# Patient Record
Sex: Female | Born: 1970 | Race: White | Hispanic: No | Marital: Married | Smoking: Never smoker
Health system: Southern US, Community
[De-identification: ages and names within clinical notes are randomized; demographics above are authoritative.]

## PROBLEM LIST (undated history)

## (undated) DIAGNOSIS — Z800 Family history of malignant neoplasm of digestive organs: Secondary | ICD-10-CM

## (undated) DIAGNOSIS — F988 Other specified behavioral and emotional disorders with onset usually occurring in childhood and adolescence: Secondary | ICD-10-CM

## (undated) DIAGNOSIS — G40909 Epilepsy, unspecified, not intractable, without status epilepticus: Secondary | ICD-10-CM

## (undated) DIAGNOSIS — F329 Major depressive disorder, single episode, unspecified: Secondary | ICD-10-CM

## (undated) DIAGNOSIS — Z9889 Other specified postprocedural states: Secondary | ICD-10-CM

## (undated) DIAGNOSIS — R32 Unspecified urinary incontinence: Secondary | ICD-10-CM

## (undated) DIAGNOSIS — E739 Lactose intolerance, unspecified: Secondary | ICD-10-CM

## (undated) DIAGNOSIS — E559 Vitamin D deficiency, unspecified: Secondary | ICD-10-CM

## (undated) DIAGNOSIS — F419 Anxiety disorder, unspecified: Secondary | ICD-10-CM

## (undated) DIAGNOSIS — R51 Headache: Secondary | ICD-10-CM

## (undated) DIAGNOSIS — Z8489 Family history of other specified conditions: Secondary | ICD-10-CM

## (undated) DIAGNOSIS — T4145XA Adverse effect of unspecified anesthetic, initial encounter: Secondary | ICD-10-CM

## (undated) DIAGNOSIS — Z8711 Personal history of peptic ulcer disease: Secondary | ICD-10-CM

## (undated) DIAGNOSIS — M1990 Unspecified osteoarthritis, unspecified site: Secondary | ICD-10-CM

## (undated) DIAGNOSIS — K5900 Constipation, unspecified: Secondary | ICD-10-CM

## (undated) DIAGNOSIS — E538 Deficiency of other specified B group vitamins: Secondary | ICD-10-CM

## (undated) DIAGNOSIS — J069 Acute upper respiratory infection, unspecified: Secondary | ICD-10-CM

## (undated) DIAGNOSIS — T8859XA Other complications of anesthesia, initial encounter: Secondary | ICD-10-CM

## (undated) DIAGNOSIS — R519 Headache, unspecified: Secondary | ICD-10-CM

## (undated) DIAGNOSIS — Z973 Presence of spectacles and contact lenses: Secondary | ICD-10-CM

## (undated) DIAGNOSIS — M722 Plantar fascial fibromatosis: Secondary | ICD-10-CM

## (undated) DIAGNOSIS — K219 Gastro-esophageal reflux disease without esophagitis: Secondary | ICD-10-CM

## (undated) DIAGNOSIS — N9089 Other specified noninflammatory disorders of vulva and perineum: Secondary | ICD-10-CM

## (undated) DIAGNOSIS — Z1509 Genetic susceptibility to other malignant neoplasm: Secondary | ICD-10-CM

## (undated) DIAGNOSIS — R112 Nausea with vomiting, unspecified: Secondary | ICD-10-CM

## (undated) DIAGNOSIS — G4730 Sleep apnea, unspecified: Secondary | ICD-10-CM

## (undated) DIAGNOSIS — F32A Depression, unspecified: Secondary | ICD-10-CM

## (undated) DIAGNOSIS — D649 Anemia, unspecified: Secondary | ICD-10-CM

## (undated) DIAGNOSIS — G2581 Restless legs syndrome: Secondary | ICD-10-CM

## (undated) HISTORY — DX: Acute upper respiratory infection, unspecified: J06.9

## (undated) HISTORY — PX: LAPAROSCOPIC GASTRIC BANDING: SHX1100

## (undated) HISTORY — PX: BILATERAL OOPHORECTOMY: SHX1221

## (undated) HISTORY — DX: Deficiency of other specified B group vitamins: E53.8

## (undated) HISTORY — DX: Unspecified osteoarthritis, unspecified site: M19.90

## (undated) HISTORY — PX: PARTIAL HYSTERECTOMY: SHX80

## (undated) HISTORY — DX: Major depressive disorder, single episode, unspecified: F32.9

## (undated) HISTORY — DX: Vitamin D deficiency, unspecified: E55.9

## (undated) HISTORY — PX: CHOLECYSTECTOMY: SHX55

## (undated) HISTORY — DX: Family history of malignant neoplasm of digestive organs: Z80.0

## (undated) HISTORY — DX: Constipation, unspecified: K59.00

## (undated) HISTORY — DX: Genetic susceptibility to other malignant neoplasm: Z15.09

## (undated) HISTORY — PX: DILATION AND CURETTAGE OF UTERUS: SHX78

## (undated) HISTORY — DX: Epilepsy, unspecified, not intractable, without status epilepticus: G40.909

## (undated) HISTORY — PX: TONSILLECTOMY: SUR1361

## (undated) HISTORY — DX: Other specified behavioral and emotional disorders with onset usually occurring in childhood and adolescence: F98.8

## (undated) HISTORY — PX: ABDOMINAL HYSTERECTOMY: SHX81

## (undated) HISTORY — PX: BREAST LUMPECTOMY: SHX2

## (undated) HISTORY — DX: Lactose intolerance, unspecified: E73.9

## (undated) HISTORY — DX: Plantar fascial fibromatosis: M72.2

## (undated) HISTORY — PX: HIATAL HERNIA REPAIR: SHX195

## (undated) HISTORY — DX: Anemia, unspecified: D64.9

## (undated) HISTORY — DX: Anxiety disorder, unspecified: F41.9

## (undated) HISTORY — PX: LAPAROSCOPIC SALPINGO OOPHERECTOMY: SHX5927

## (undated) HISTORY — DX: Depression, unspecified: F32.A

## (undated) HISTORY — DX: Restless legs syndrome: G25.81

## (undated) HISTORY — DX: Personal history of peptic ulcer disease: Z87.11

---
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
## 1999-05-10 ENCOUNTER — Inpatient Hospital Stay (HOSPITAL_COMMUNITY): Admission: AD | Admit: 1999-05-10 | Discharge: 1999-05-15 | Payer: Self-pay | Admitting: Psychiatry

## 2004-11-10 HISTORY — PX: ABDOMINAL HYSTERECTOMY: SHX81

## 2015-11-11 HISTORY — PX: BILATERAL OOPHORECTOMY: SHX1221

## 2017-04-27 DIAGNOSIS — S338XXA Sprain of other parts of lumbar spine and pelvis, initial encounter: Secondary | ICD-10-CM | POA: Diagnosis not present

## 2017-04-27 DIAGNOSIS — M546 Pain in thoracic spine: Secondary | ICD-10-CM | POA: Diagnosis not present

## 2017-11-10 HISTORY — PX: CHOLECYSTECTOMY: SHX55

## 2017-11-18 ENCOUNTER — Encounter (INDEPENDENT_AMBULATORY_CARE_PROVIDER_SITE_OTHER): Payer: Self-pay

## 2017-11-19 ENCOUNTER — Ambulatory Visit (INDEPENDENT_AMBULATORY_CARE_PROVIDER_SITE_OTHER): Payer: 59 | Admitting: Internal Medicine

## 2017-11-19 ENCOUNTER — Encounter (INDEPENDENT_AMBULATORY_CARE_PROVIDER_SITE_OTHER): Payer: Self-pay | Admitting: *Deleted

## 2017-11-19 ENCOUNTER — Encounter (INDEPENDENT_AMBULATORY_CARE_PROVIDER_SITE_OTHER): Payer: Self-pay | Admitting: Internal Medicine

## 2017-11-19 ENCOUNTER — Telehealth (INDEPENDENT_AMBULATORY_CARE_PROVIDER_SITE_OTHER): Payer: Self-pay | Admitting: *Deleted

## 2017-11-19 DIAGNOSIS — F32A Depression, unspecified: Secondary | ICD-10-CM | POA: Insufficient documentation

## 2017-11-19 DIAGNOSIS — F419 Anxiety disorder, unspecified: Secondary | ICD-10-CM | POA: Insufficient documentation

## 2017-11-19 DIAGNOSIS — F329 Major depressive disorder, single episode, unspecified: Secondary | ICD-10-CM | POA: Insufficient documentation

## 2017-11-19 DIAGNOSIS — Z1509 Genetic susceptibility to other malignant neoplasm: Secondary | ICD-10-CM | POA: Diagnosis not present

## 2017-11-19 DIAGNOSIS — G2581 Restless legs syndrome: Secondary | ICD-10-CM | POA: Insufficient documentation

## 2017-11-19 DIAGNOSIS — Z800 Family history of malignant neoplasm of digestive organs: Secondary | ICD-10-CM | POA: Diagnosis not present

## 2017-11-19 HISTORY — DX: Family history of malignant neoplasm of digestive organs: Z80.0

## 2017-11-19 HISTORY — DX: Genetic susceptibility to other malignant neoplasm: Z15.09

## 2017-11-19 MED ORDER — PEG 3350-KCL-NA BICARB-NACL 420 G PO SOLR: 4000.0000 mL | Freq: Once | ORAL | 0 refills | Status: AC

## 2017-11-19 NOTE — Telephone Encounter (Signed)
Patient needs trilyte 

## 2017-11-19 NOTE — Patient Instructions (Signed)
Colonoscopy. The risks of bleeding, perforation and infection were reviewed with patient.  

## 2017-11-19 NOTE — Progress Notes (Addendum)
   Subjective:    Patient ID: Heather Gibbs, female    DOB: 11-08-1971, 47 y.o.   MRN: [id number]  HPI Referred by Dr. Sherrie Sport screening colonoscopy. Hx of Lynch Syndrome. Had gallbladder removed  11/16/2016 for gallstones  11/09/2017 H and H 12.3 and 36.3, total bili 0.6, AST 26, ALT 24, ALP 60 Family hx significant for cancer.  Father had colon cancer in his 79s. Paternal grandfather had colon cancer in his 34s.  One sister had colon cancer in her late 4s or early 68s.  She had a colonoscopy 2 yrs ago at [facility] She also had a colonoscopy 4 yrs ago in [place] by Dr Donavan Foil She says she is still tender from her GB surgery. Has a BM x 1 since her surgery. Usually has a BM daily.   12/28/2015 Colonoscopy: Indications: Lynch Syndrome  Surgeon: Mariana Arn. Normal Exam.    11/09/2017 US abdomen complete: Generalized abdominal pain Gallstones without sonographic evidence of acute cholecystitis.  Elsewhere no acute intra-abdominal abnormality is observed.  Review of Systems No past medical history on file.    No Known Allergies  Current Outpatient Medications on File Prior to Visit  Medication Sig Dispense Refill  . escitalopram (LEXAPRO) 10 MG tablet Take 10 mg by mouth at bedtime.    . gabapentin (NEURONTIN) 100 MG capsule Take 100 mg by mouth 3 (three) times daily.    Marland Kitchen HYDROcodone-acetaminophen (NORCO/VICODIN) 5-325 MG tablet Take 1 tablet by mouth every 6 (six) hours as needed for moderate pain.    Marland Kitchen rOPINIRole (REQUIP) 2 MG tablet Take 2 mg by mouth at bedtime.    . traZODone (DESYREL) 50 MG tablet Take 50 mg by mouth at bedtime.     No current facility-administered medications on file prior to visit.         Objective:   Physical Exam Blood pressure 102/64, pulse 72, temperature 98.1 F (36.7 C), height 5\' 3"  (1.6 m), weight 186 lb 9.6 oz (84.6 kg). Alert and oriented. Skin warm and dry. Oral mucosa is moist.   . Sclera anicteric, conjunctivae is pink. Thyroid not  enlarged. No cervical lymphadenopathy. Lungs clear. Heart regular rate and rhythm.  Abdomen is soft. Bowel sounds are positive. No hepatomegaly. No abdominal masses felt. No tenderness.  No edema to lower extremities.           Assessment & Plan:  Lynch syndrome. Family hx of colon cancer.  Surveillance colonoscopy

## 2018-02-18 ENCOUNTER — Encounter (HOSPITAL_COMMUNITY)
Admission: RE | Disposition: A | Discharge status: Home / Self Care | Payer: Self-pay | Source: Ambulatory Visit | Attending: Internal Medicine

## 2018-02-18 ENCOUNTER — Encounter (HOSPITAL_COMMUNITY): Payer: Self-pay | Admitting: *Deleted

## 2018-02-18 ENCOUNTER — Other Ambulatory Visit: Payer: Self-pay

## 2018-02-18 ENCOUNTER — Ambulatory Visit (HOSPITAL_COMMUNITY)
Admission: RE | Admit: 2018-02-18 | Discharge: 2018-02-18 | Disposition: A | Discharge status: Home / Self Care | Payer: 59 | Source: Ambulatory Visit | Attending: Internal Medicine | Admitting: Internal Medicine

## 2018-02-18 DIAGNOSIS — K5730 Diverticulosis of large intestine without perforation or abscess without bleeding: Secondary | ICD-10-CM

## 2018-02-18 DIAGNOSIS — Z1509 Genetic susceptibility to other malignant neoplasm: Secondary | ICD-10-CM

## 2018-02-18 DIAGNOSIS — Z803 Family history of malignant neoplasm of breast: Secondary | ICD-10-CM | POA: Insufficient documentation

## 2018-02-18 DIAGNOSIS — Z800 Family history of malignant neoplasm of digestive organs: Secondary | ICD-10-CM | POA: Insufficient documentation

## 2018-02-18 DIAGNOSIS — F419 Anxiety disorder, unspecified: Secondary | ICD-10-CM | POA: Insufficient documentation

## 2018-02-18 DIAGNOSIS — Z9884 Bariatric surgery status: Secondary | ICD-10-CM | POA: Diagnosis not present

## 2018-02-18 DIAGNOSIS — F329 Major depressive disorder, single episode, unspecified: Secondary | ICD-10-CM | POA: Insufficient documentation

## 2018-02-18 DIAGNOSIS — Z1211 Encounter for screening for malignant neoplasm of colon: Secondary | ICD-10-CM | POA: Insufficient documentation

## 2018-02-18 DIAGNOSIS — Z888 Allergy status to other drugs, medicaments and biological substances status: Secondary | ICD-10-CM | POA: Diagnosis not present

## 2018-02-18 DIAGNOSIS — K648 Other hemorrhoids: Secondary | ICD-10-CM | POA: Insufficient documentation

## 2018-02-18 DIAGNOSIS — Z79899 Other long term (current) drug therapy: Secondary | ICD-10-CM | POA: Diagnosis not present

## 2018-02-18 DIAGNOSIS — G2581 Restless legs syndrome: Secondary | ICD-10-CM | POA: Insufficient documentation

## 2018-02-18 HISTORY — DX: Headache: R51

## 2018-02-18 HISTORY — DX: Nausea with vomiting, unspecified: R11.2

## 2018-02-18 HISTORY — PX: COLONOSCOPY: SHX5424

## 2018-02-18 HISTORY — DX: Other specified postprocedural states: Z98.890

## 2018-02-18 HISTORY — DX: Adverse effect of unspecified anesthetic, initial encounter: T41.45XA

## 2018-02-18 HISTORY — DX: Other complications of anesthesia, initial encounter: T88.59XA

## 2018-02-18 SURGERY — COLONOSCOPY
Anesthesia: Moderate Sedation

## 2018-02-18 MED ORDER — MEPERIDINE HCL 50 MG/ML IJ SOLN
INTRAMUSCULAR | Status: DC | PRN
  Administered 2018-02-18 (×2): 25 mg via INTRAVENOUS

## 2018-02-18 MED ORDER — SODIUM CHLORIDE 0.9 % IV SOLN
INTRAVENOUS | Status: DC
  Administered 2018-02-18: 08:00:00 via INTRAVENOUS

## 2018-02-18 MED ORDER — SIMETHICONE 40 MG/0.6ML PO SUSP
ORAL | Status: DC | PRN
  Administered 2018-02-18: 100 mL

## 2018-02-18 MED ORDER — SIMETHICONE 40 MG/0.6ML PO SUSP
ORAL | Status: AC
  Filled 2018-02-18: qty 30

## 2018-02-18 MED ORDER — MEPERIDINE HCL 50 MG/ML IJ SOLN
INTRAMUSCULAR | Status: AC
  Filled 2018-02-18: qty 1

## 2018-02-18 MED ORDER — MIDAZOLAM HCL 5 MG/5ML IJ SOLN
INTRAMUSCULAR | Status: DC | PRN
  Administered 2018-02-18 (×5): 2 mg via INTRAVENOUS

## 2018-02-18 MED ORDER — MIDAZOLAM HCL 5 MG/5ML IJ SOLN
INTRAMUSCULAR | Status: AC
  Filled 2018-02-18: qty 10

## 2018-02-18 NOTE — Discharge Instructions (Signed)
Resume usual medications as before. High-fiber diet. No driving for 24 hours. Next colonoscopy in 2 years.   Colonoscopy, Adult, Care After This sheet gives you information about how to care for yourself after your procedure. Your health care provider may also give you more specific instructions. If you have problems or questions, contact your health care provider.  Dr Laural Golden:  [phone]; after hours and weekends, call the hospital and have the GI doctor on call paged.  They will call you back.  What can I expect after the procedure? After the procedure, it is common to have:  A small amount of blood in your stool for 24 hours after the procedure.  Some gas.  Mild abdominal cramping or bloating.  Follow these instructions at home: General instructions   For the first 24 hours after the procedure: ? Do not drive or use machinery. ? Do not sign important documents. ? Do not drink alcohol. ? Eat soft, easy-to-digest foods. ? Rest often.  Take over-the-counter or prescription medicines only as told by your health care provider.  It is up to you to get the results of your procedure. Ask your health care provider, or the department performing the procedure, when your results will be ready. Relieving cramping and bloating  Try walking around when you have cramps or feel bloated. Eating and drinking  Drink enough fluid to keep your urine clear or pale yellow.  Resume your normal diet as instructed by your health care provider. Avoid heavy or fried foods that are hard to digest. Contact a health care provider if:  You have blood in your stool 2-3 days after the procedure. Get help right away if:  You have more than a small spotting of blood in your stool.  You pass large blood clots in your stool.  Your abdomen is swollen.  You have nausea or vomiting.  You have a fever.  You have increasing abdominal pain that is not relieved with medicine. This information is not  intended to replace advice given to you by your health care provider. Make sure you discuss any questions you have with your health care provider. Document Released: 06/10/2004 Document Revised: 07/21/2016 Document Reviewed: 01/08/2016 Elsevier Interactive Patient Education  [employer].

## 2018-02-18 NOTE — Op Note (Signed)
[facility] Patient Name: Heather Gibbs Procedure Date: 02/18/2018 8:24 AM MRN: [id number] Date of Birth: 01-30-1971 Attending MD: Hildred Laser , MD CSN: [id number] Age: 47 Admit Type: Outpatient Procedure:                Colonoscopy Indications:              Colon cancer screening in patient at increased                            risk: Family history of hereditary nonpolyposis                            colorectal cancer in 1st-degree relative, Lynch                            Syndrome Providers:                Hildred Laser, MD, Rosina Lowenstein, RN, Nelma Rothman,                            Technician Referring MD:             Stoney Bang, MD Medicines:                Meperidine 50 mg IV, Midazolam 10 mg IV Complications:            No immediate complications. Estimated Blood Loss:     Estimated blood loss: none. Procedure:                Pre-Anesthesia Assessment:                           - Prior to the procedure, a History and Physical                            was performed, and patient medications and                            allergies were reviewed. The patient's tolerance of                            previous anesthesia was also reviewed. The risks                            and benefits of the procedure and the sedation                            options and risks were discussed with the patient.                            All questions were answered, and informed consent                            was obtained. ASA Grade Assessment: II - A patient  with mild systemic disease. After reviewing the                            risks and benefits, the patient was deemed in                            satisfactory condition to undergo the procedure.                           After obtaining informed consent, the colonoscope                            was passed under direct vision. Throughout the                            procedure, the patient's  blood pressure, pulse, and                            oxygen saturations were monitored continuously. The                            [id number] ([id number]) scope was introduced through                            the anus and advanced to the the cecum, identified                            by appendiceal orifice and ileocecal valve. The                            colonoscopy was performed without difficulty. The                            patient tolerated the procedure well. The quality                            of the bowel preparation was excellent. The                            ileocecal valve, appendiceal orifice, and rectum                            were photographed. Scope In: 8:55:53 AM Scope Out: 9:14:56 AM Scope Withdrawal Time: 0 hours 10 minutes 4 seconds  Total Procedure Duration: 0 hours 19 minutes 3 seconds  Findings:      The perianal and digital rectal examinations were normal.      A single medium-mouthed diverticulum was found in the cecum.      The exam was otherwise normal throughout the examined colon.      Internal hemorrhoids were found during retroflexion. The hemorrhoids       were small. Impression:               - Diverticulosis in the cecum.                           -  Internal hemorrhoids.                           - No specimens collected. Moderate Sedation:      Moderate (conscious) sedation was administered by the endoscopy nurse       and supervised by the endoscopist. The following parameters were       monitored: oxygen saturation, heart rate, blood pressure, CO2       capnography and response to care. Total physician intraservice time was       37 minutes. Recommendation:           - Patient has a contact number available for                            emergencies. The signs and symptoms of potential                            delayed complications were discussed with the                            patient. Return to normal activities tomorrow.                             Written discharge instructions were provided to the                            patient.                           - High fiber diet today.                           - Continue present medications.                           - Repeat colonoscopy in 2 years for screening                            purposes. Procedure Code(s):        --- Professional ---                           [phone], Colonoscopy, flexible; diagnostic, including                            collection of specimen(s) by brushing or washing,                            when performed (separate procedure)                           G0500, Moderate sedation services provided by the                            same physician or other qualified health care  professional performing a gastrointestinal                            endoscopic service that sedation supports,                            requiring the presence of an independent trained                            observer to assist in the monitoring of the                            patient's level of consciousness and physiological                            status; initial 15 minutes of intra-service time;                            patient age 72 years or older (additional time may                            be reported with [phone], as appropriate)                           [phone], Moderate sedation services provided by the                            same physician or other qualified health care                            professional performing the diagnostic or                            therapeutic service that the sedation supports,                            requiring the presence of an independent trained                            observer to assist in the monitoring of the                            patient's level of consciousness and physiological                            status; each additional 15 minutes intraservice                             time (List separately in addition to code for                            primary service) Diagnosis Code(s):        --- Professional ---  Z80.0, Family history of malignant neoplasm of                            digestive organs                           K64.8, Other hemorrhoids                           Z15.09, Genetic susceptibility to other malignant                            neoplasm                           K57.30, Diverticulosis of large intestine without                            perforation or abscess without bleeding CPT copyright 2017 American Medical Association. All rights reserved. The codes documented in this report are preliminary and upon coder review may  be revised to meet current compliance requirements. Hildred Laser, MD Hildred Laser, MD 02/18/2018 9:22:37 AM This report has been signed electronically. Number of Addenda: 0

## 2018-02-18 NOTE — H&P (Signed)
Heather Gibbs is an 47 y.o. female.   Chief Complaint: Patient is here for colonoscopy. HPI: Patient is 47 year old Caucasian female who was genetic abnormality for Lynch syndrome and is here for higher screening colonoscopy.  Prior 3 colonoscopies were normal.  Last exam was 2 years ago. History significant for colon carcinoma in paternal grandfather who was in his 30s.  Father had colon carcinoma in his 53s and is doing fine.  Her sister had surgery for colon carcinoma in her 64s and is doing fine in her 66s.  Family history is also positive for breast cancer in both sides of the family. [name] states she has developed constipation following cholecystectomy.  She is to have a daily bowel movement and now she has BM twice a week.  She takes Colace every other day.  She denies abdominal pain or rectal bleeding.  Past Medical History:  Diagnosis Date  . Anxiety   . Complication of anesthesia   . Depression   . Family hx of colon cancer 11/19/2017  . Headache   . Lynch syndrome 11/19/2017  . PONV (postoperative nausea and vomiting)   . Restless leg     Past Surgical History:  Procedure Laterality Date  . BILATERAL OOPHORECTOMY    . CHOLECYSTECTOMY    . DILATION AND CURETTAGE OF UTERUS    . LAPAROSCOPIC GASTRIC BANDING     2008  . PARTIAL HYSTERECTOMY    . TONSILLECTOMY      Family History  Problem Relation Age of Onset  . Hypertension Mother   . Diabetes Mother    Social History:  reports that she has never smoked. She has never used smokeless tobacco. She reports that she does not drink alcohol or use drugs.  Allergies:  Allergies  Allergen Reactions  . Adhesive [Tape] Other (See Comments)    Blistering    Medications Prior to Admission  Medication Sig Dispense Refill  . escitalopram (LEXAPRO) 20 MG tablet Take 10 mg by mouth at bedtime.    . gabapentin (NEURONTIN) 300 MG capsule Take 300 mg by mouth at bedtime.     Marland Kitchen rOPINIRole (REQUIP) 2 MG tablet Take 2 mg by mouth at  bedtime.    . traZODone (DESYREL) 50 MG tablet Take 50 mg by mouth at bedtime.      No results found for this or any previous visit (from the past 48 hour(s)). No results found.  ROS  Blood pressure 99/65, pulse 85, temperature 98.1 F (36.7 C), temperature source Oral, resp. rate 10, SpO2 100 %. Physical Exam  Constitutional: She appears well-developed and well-nourished.  HENT:  Mouth/Throat: Oropharynx is clear and moist.  Eyes: Conjunctivae are normal. No scleral icterus.  Neck: No thyromegaly present.  Cardiovascular: Normal rate, regular rhythm and normal heart sounds.  No murmur heard. Respiratory: Effort normal and breath sounds normal.  GI:  Abdomen is full.  Scar noted across suprapubic region along with laparoscopic scars.  Abdomen soft and nontender without organomegaly or masses.  Musculoskeletal: She exhibits no edema.  Neurological: She is alert.  Skin: Skin is warm and dry.     Assessment/Plan High risk screening colonoscopy.   Hildred Laser, MD 02/18/2018, 8:42 AM

## 2018-02-23 ENCOUNTER — Encounter (HOSPITAL_COMMUNITY): Payer: Self-pay | Admitting: Internal Medicine

## 2018-03-16 ENCOUNTER — Other Ambulatory Visit: Payer: Self-pay

## 2018-03-16 ENCOUNTER — Emergency Department (HOSPITAL_COMMUNITY)
Admission: EM | Admit: 2018-03-16 | Discharge: 2018-03-16 | Disposition: A | Discharge status: Home / Self Care | Payer: No Typology Code available for payment source | Attending: Emergency Medicine | Admitting: Emergency Medicine

## 2018-03-16 ENCOUNTER — Encounter (HOSPITAL_COMMUNITY): Payer: Self-pay | Admitting: Emergency Medicine

## 2018-03-16 ENCOUNTER — Emergency Department (HOSPITAL_COMMUNITY): Payer: No Typology Code available for payment source

## 2018-03-16 DIAGNOSIS — Z79899 Other long term (current) drug therapy: Secondary | ICD-10-CM | POA: Diagnosis not present

## 2018-03-16 DIAGNOSIS — M545 Low back pain: Secondary | ICD-10-CM | POA: Diagnosis present

## 2018-03-16 DIAGNOSIS — M5416 Radiculopathy, lumbar region: Secondary | ICD-10-CM

## 2018-03-16 DIAGNOSIS — M5441 Lumbago with sciatica, right side: Secondary | ICD-10-CM | POA: Diagnosis not present

## 2018-03-16 MED ORDER — PREDNISONE 50 MG PO TABS
60.0000 mg | ORAL_TABLET | Freq: Once | ORAL | Status: AC
  Administered 2018-03-16: 60 mg via ORAL
  Filled 2018-03-16: qty 1

## 2018-03-16 MED ORDER — HYDROCODONE-ACETAMINOPHEN 5-325 MG PO TABS
2.0000 | ORAL_TABLET | Freq: Once | ORAL | Status: AC
  Administered 2018-03-16: 2 via ORAL
  Filled 2018-03-16: qty 2

## 2018-03-16 MED ORDER — METHYLPREDNISOLONE 4 MG PO TBPK: ORAL_TABLET | ORAL | 0 refills | Status: DC

## 2018-03-16 MED ORDER — KETOROLAC TROMETHAMINE 60 MG/2ML IM SOLN
60.0000 mg | Freq: Once | INTRAMUSCULAR | Status: AC
  Administered 2018-03-16: 60 mg via INTRAMUSCULAR
  Filled 2018-03-16: qty 2

## 2018-03-16 MED ORDER — METHOCARBAMOL 500 MG PO TABS
1000.0000 mg | ORAL_TABLET | Freq: Once | ORAL | Status: AC
  Administered 2018-03-16: 1000 mg via ORAL
  Filled 2018-03-16: qty 2

## 2018-03-16 MED ORDER — METHOCARBAMOL 500 MG PO TABS: 1000.0000 mg | ORAL_TABLET | Freq: Four times a day (QID) | ORAL | 0 refills | Status: DC | PRN

## 2018-03-16 MED ORDER — HYDROCODONE-ACETAMINOPHEN 5-325 MG PO TABS: ORAL_TABLET | ORAL | 0 refills | Status: DC

## 2018-03-16 NOTE — ED Triage Notes (Signed)
Pt in wheelchair, comes in EMS, states she can not walk, pain radiating down right leg.

## 2018-03-16 NOTE — Discharge Instructions (Signed)
Take the prescriptions as directed.  Apply moist heat or ice to the area(s) of discomfort, for 15 minutes at a time, several times per day for the next few days.  Do not fall asleep on a heating or ice pack.  Call the Neurosurgical provider tomorrow morning to schedule a follow up appointment this week.  Return to the Emergency Department immediately if worsening.

## 2018-03-16 NOTE — ED Notes (Signed)
Patient transported to MRI 

## 2018-03-16 NOTE — ED Notes (Signed)
Pt wheeled to waiting room. Pt verbalized understanding of discharge instructions.   

## 2018-03-16 NOTE — ED Triage Notes (Signed)
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 Pt is employed at [employer]

## 2018-03-16 NOTE — ED Notes (Signed)
Pt taken to lab for urine sample and blood work for [employer].

## 2018-03-16 NOTE — ED Notes (Signed)
Pt returned from MRI °

## 2018-03-16 NOTE — ED Provider Notes (Signed)
[facility] EMERGENCY DEPARTMENT Provider Note   CSN: [id number] Arrival date & time: 03/16/18  1548     History   Chief Complaint Chief Complaint  Patient presents with  . Back Injury    HPI Heather Gibbs is a 47 y.o. female.  HPI  Pt was seen at 1640. Per pt, c/o sudden onset and persistence of constant right sided low back "pain" for the past several hours. 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 LBP radiates down the back of her right leg. Pain worsens with palpation of the area and body position changes. Pt denies falling. States she has been able to stand since the incident, but has been unable to walk because "my leg isn't moving right." Denies incont/retention of bowel or bladder, no saddle anesthesia, no focal motor weakness, no tingling/numbness in extremities, no fevers, no direct injury, no abd pain.   The symptoms have been associated with no other complaints.    History reviewed. No pertinent past medical history.  There are no active problems to display for this patient.   Past Surgical History:  Procedure Laterality Date  . ABDOMINAL HYSTERECTOMY    . TONSILLECTOMY       OB History   None      Home Medications    Prior to Admission medications   Medication Sig Start Date End Date Taking? Authorizing Provider  cetirizine (ZYRTEC) 10 MG tablet Take 10 mg by mouth daily as needed for allergies.   Yes [provider]  escitalopram (LEXAPRO) 20 MG tablet Take 10 mg by mouth at bedtime. 12/14/17  Yes [provider]  gabapentin (NEURONTIN) 300 MG capsule Take 300 mg by mouth at bedtime. 12/10/17  Yes [provider]  mirabegron ER (MYRBETRIQ) 50 MG TB24 tablet Take 50 mg by mouth at bedtime.   Yes [provider]  rOPINIRole (REQUIP) 2 MG tablet Take 2 mg by mouth at bedtime. 12/11/17  Yes [provider]  traZODone (DESYREL) 50 MG tablet Take 50 mg by mouth  at bedtime. 12/14/17  Yes [provider]    Family History No family history on file.  Social History Social History   Tobacco Use  . Smoking status: Never Smoker  . Smokeless tobacco: Never Used  Substance Use Topics  . Alcohol use: Not Currently  . Drug use: Not Currently     Allergies   Patient has no known allergies.   Review of Systems Review of Systems ROS: Statement: All systems negative except as marked or noted in the HPI; Constitutional: Negative for fever and chills. ; ; Eyes: Negative for eye pain, redness and discharge. ; ; ENMT: Negative for ear pain, hoarseness, nasal congestion, sinus pressure and sore throat. ; ; Cardiovascular: Negative for chest pain, palpitations, diaphoresis, dyspnea and peripheral edema. ; ; Respiratory: Negative for cough, wheezing and stridor. ; ; Gastrointestinal: Negative for nausea, vomiting, diarrhea, abdominal pain, blood in stool, hematemesis, jaundice and rectal bleeding. . ; ; Genitourinary: Negative for dysuria, flank pain and hematuria. ; ; Musculoskeletal: +LBP. Negative for neck pain. Negative for swelling and trauma.; ; Skin: Negative for pruritus, rash, abrasions, blisters, bruising and skin lesion.; ; Neuro: Negative for headache, lightheadedness and neck stiffness. Negative for weakness, altered level of consciousness, altered mental status, extremity weakness, paresthesias, involuntary movement, seizure and syncope.       Physical Exam Updated Vital Signs BP (!) 157/80 (BP  Location: Right Arm)   Pulse 65   Temp 97.8 F (36.6 C) (Oral)   Resp 16   Ht 5' 2.5" (1.588 m)   Wt 85.3 kg (188 lb)   SpO2 100%   BMI 33.84 kg/m   Physical Exam 1645: Physical examination:  Nursing notes reviewed; Vital signs and O2 SAT reviewed;  Constitutional: Well developed, Well nourished, Well hydrated, In no acute distress; Head:  Normocephalic, atraumatic; Eyes: EOMI, PERRL, No scleral icterus; ENMT: Mouth and pharynx normal,  Mucous membranes moist; Neck: Supple, Full range of motion, No lymphadenopathy; Cardiovascular: Regular rate and rhythm, No gallop; Respiratory: Breath sounds clear & equal bilaterally, No wheezes.  Speaking full sentences with ease, Normal respiratory effort/excursion; Chest: Nontender, Movement normal; Abdomen: Soft, Nontender, Nondistended, Normal bowel sounds; Genitourinary: No CVA tenderness; Spine:  No midline CS, TS, LS tenderness. +TTP right lumbar paraspinal muscles.;; Extremities: Peripheral pulses normal, No tenderness, No edema, No calf edema or asymmetry.; Neuro: AA&Ox3, Major CN grossly intact.  Speech clear. No gross focal motor or sensory deficits in extremities. Strength 5/5 equal bilat UE's and LE's, including great toe dorsiflexion left. Slight weakness with right great toe dorsiflexion.  DTR 2/4 equal bilat UE's and LE's.  No gross sensory deficits.  +right straight leg raise..; Skin: Color normal, Warm, Dry.   ED Treatments / Results  Labs (all labs ordered are listed, but only abnormal results are displayed)   EKG None  Radiology   Procedures Procedures (including critical care time)  Medications Ordered in ED Medications  ketorolac (TORADOL) injection 60 mg (has no administration in time range)     Initial Impression / Assessment and Plan / ED Course  I have reviewed the triage vital signs and the nursing notes.  Pertinent labs & imaging results that were available during my care of the patient were reviewed by me and considered in my medical decision making (see chart for details).  MDM Reviewed: previous chart, nursing note and vitals Interpretation: MRI   Mr Lumbar Spine Wo Contrast Result Date: 03/16/2018 CLINICAL DATA:  Severe low back pain extending to the right lower extremity EXAM: MRI LUMBAR SPINE WITHOUT CONTRAST TECHNIQUE: Multiplanar, multisequence MR imaging of the lumbar spine was performed. No intravenous contrast was administered. COMPARISON:   None. FINDINGS: Segmentation: Normal. The lowest disc space is considered to be L5-S1. Alignment:  Normal Vertebrae: No acute compression fracture, discitis-osteomyelitis of focal marrow lesion. Conus medullaris and cauda equina: The conus medullaris terminates at the L1 level. The cauda equina and conus medullaris are both normal. Paraspinal and other soft tissues: The visualized aorta, IVC and iliac vessels are normal. The visualized retroperitoneal organs and paraspinal soft tissues are normal. Disc levels: T10-L1 is imaged only in the sagittal plane, with no visible disc herniation or stenosis. The L1-L4 disc levels are normal. L4-L5: Mild disc desiccation. Minimal bulge without stenosis. There is a right foraminal annular fissure in close proximity to the exiting right L4 nerve root. L5-S1: Normal. IMPRESSION: L4-L5 right foraminal annular fissure in close proximity to the exiting right L4 nerve root. This may be a source of right L4 distribution radiculopathy. Electronically Signed   By: Ulyses Jarred M.D.   On: 03/16/2018 18:07    1835: MRI as above. Pt is able to stand and transfer with assist while in the ED. Offered crutches for support. Pt initially did not want narcotic pain meds; IM toradol given. Pt has now changed her mind and will take meds. T/C returned from Neurosurgery  APP [place], case discussed, including:  HPI, pertinent PM/SHx, VS/PE, dx testing, ED course and treatment:  No acute neurosurgical intervention needed at this time, generally tx conservatively, rx pain meds/mm relaxer/medrol dose pack, call office for f/u this week (possibly Thursday may be available). Dx and testing d/w pt and family.  Questions answered.  Verb understanding, agreeable to d/c home with outpt f/u.    Final Clinical Impressions(s) / ED Diagnoses   Final diagnoses:  None    ED Discharge Orders    None       Francine Graven, DO 03/19/18 [id number]

## 2018-03-16 NOTE — ED Notes (Signed)
Pt still in MRI. Will give Toradol injection when patient returns.

## 2018-03-17 ENCOUNTER — Encounter (HOSPITAL_COMMUNITY): Payer: Self-pay | Admitting: Internal Medicine

## 2018-11-17 DIAGNOSIS — F04 Amnestic disorder due to known physiological condition: Secondary | ICD-10-CM | POA: Diagnosis not present

## 2018-11-17 DIAGNOSIS — M25552 Pain in left hip: Secondary | ICD-10-CM | POA: Diagnosis not present

## 2018-11-17 DIAGNOSIS — Z6833 Body mass index (BMI) 33.0-33.9, adult: Secondary | ICD-10-CM | POA: Diagnosis not present

## 2018-11-30 DIAGNOSIS — Z1231 Encounter for screening mammogram for malignant neoplasm of breast: Secondary | ICD-10-CM | POA: Diagnosis not present

## 2018-12-28 ENCOUNTER — Encounter: Payer: Self-pay | Admitting: Neurology

## 2018-12-28 ENCOUNTER — Ambulatory Visit: Payer: BLUE CROSS/BLUE SHIELD | Admitting: Neurology

## 2018-12-28 VITALS — BP 132/83 | HR 70 | Ht 62.0 in | Wt 192.0 lb

## 2018-12-28 DIAGNOSIS — R419 Unspecified symptoms and signs involving cognitive functions and awareness: Secondary | ICD-10-CM

## 2018-12-28 DIAGNOSIS — R4189 Other symptoms and signs involving cognitive functions and awareness: Secondary | ICD-10-CM

## 2018-12-28 DIAGNOSIS — G2581 Restless legs syndrome: Secondary | ICD-10-CM

## 2018-12-28 DIAGNOSIS — G40909 Epilepsy, unspecified, not intractable, without status epilepticus: Secondary | ICD-10-CM

## 2018-12-28 DIAGNOSIS — E538 Deficiency of other specified B group vitamins: Secondary | ICD-10-CM | POA: Diagnosis not present

## 2018-12-28 DIAGNOSIS — G9340 Encephalopathy, unspecified: Secondary | ICD-10-CM

## 2018-12-28 DIAGNOSIS — R413 Other amnesia: Secondary | ICD-10-CM | POA: Diagnosis not present

## 2018-12-28 DIAGNOSIS — R410 Disorientation, unspecified: Secondary | ICD-10-CM

## 2018-12-28 NOTE — Patient Instructions (Addendum)
MRI of the brain w/wo contrast Formal memory testing in [facility] (schedule at the front desk) After EEG we will schedule a 3-day extended EEG Extensive Labs  Memory Compensation Strategies  1. Use "WARM" strategy.  W= write it down  A= associate it  R= repeat it  M= make a mental note  2.   You can keep a Social worker.  Use a 3-ring notebook with sections for the following: calendar, important names and phone numbers,  medications, doctors' names/phone numbers, lists/reminders, and a section to journal what you did  each day.   3.    Use a calendar to write appointments down.  4.    Write yourself a schedule for the day.  This can be placed on the calendar or in a separate section of the Memory Notebook.  Keeping a  regular schedule can help memory.  5.    Use medication organizer with sections for each day or morning/evening pills.  You may need help loading it  6.    Keep a basket, or pegboard by the door.  Place items that you need to take out with you in the basket or on the pegboard.  You may also want to  include a message board for reminders.  7.    Use sticky notes.  Place sticky notes with reminders in a place where the task is performed.  For example: " turn off the  stove" placed by the stove, "lock the door" placed on the door at eye level, " take your medications" on  the bathroom mirror or by the place where you normally take your medications.  8.    Use alarms/timers.  Use while cooking to remind yourself to check on food or as a reminder to take your medicine, or as a  reminder to make a call, or as a reminder to perform another task, etc.

## 2018-12-28 NOTE — Progress Notes (Addendum)
[facility]    Provider:  Dr Jaynee Eagles Referring Provider: Neale Burly, MD Primary Care Provider:  Neale Burly, MD  CC:  " Questionable cognitive deficit versus seizure activity, increasing memory deficit, difficulty processing information, sudden onset urinary incontinence, difficulty concentrating ". Mother with depression.   Addendum 03/16/2019: 72 hour EEG was normal every time she pressed the button and reported "confusion", 9 times in total. There were no related EEG events when she pushed the button. So the episodes of confusion she is reporting are not seizures. A few times we saw some slowing which could be benign such as drowsiness during the day but the slowing is not epileptic (or epileptiform) in nature so this is not an abnormality we would see due to seizures. Again, the episodes are not epileptic.  HPI:  Heather Gibbs is a 48 y.o. female here as requested by provider Sherrie Sport, Samul Dada, MD for cognitive changes. 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 She started forgetting things at the [employer], she couldn't "get it together", she was under a lot of stress and was separated from her husband. Her husband is here today and also provides much information. She would leave work and drive home and become lost. Her friends would use a tracking app on her phone. She went to the ED and an MRI in [place] was performed which was normal. EEG, MRI, Carotid studies all normal. They thought it was psychiatric. She went back to work and was out again, she has to take leaves of absences. She has had a lot of trouble keeping up at work in various jobs, lots of trouble processing and staying organized. She has symptoms every day and has worsened, waxes and wanes, gets better then gets worse. She is crying today in the office. She tried one  morning for 30 minutes to make a pancake and couldn't. Husband pays the bills, he does everything. She loses cash, she loses everything. She endorses depression and anxiety. Husband feels it is progressing. She is having episodes of incontinence recently bu this was after cholecystectomy. No focal weakness. She does feel egenrally weak all ove but nothing focal. No numbness or tingling.  Great grandmother had dementia. She doesn't know her father's history. She has racing thoughts as well, insomnia, she has genetic marker for Lynch Syndrome, her brain doesn;t shut off. She desn't complete things, she is very distractable. No other focal neurologic deficits, associated symptoms, inciting events or modifiable factors. No symptoms of sleep apnea.   Reviewed notes, labs and imaging from outside physicians, which showed:  Reviewed notes from Dr. Sherrie Sport.  Patient states she is having cognitive issues lately, having trouble remembering things, she cannot process tasks in order such as cooking a meal, she states that she feels nothing right in the head.  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  Reviewed physical exam which was normal.  Neurologic exam normal.  Anxiety noted.  Of note she is on multiple medications including Neurontin, Lexapro, trazodone, Topamax.  Review of Systems: Patient complains of symptoms per HPI as well as the following symptoms: Memory loss, confusion, headache, insomnia, restless legs,  depression, anxiety, racing thoughts, fatigue. Pertinent negatives and positives per HPI. All others negative.   Social History   Socioeconomic History  . Marital status: Married    Spouse name: Not on file  . Number of children: 3  . Years of education: 16  . Highest education level: Bachelor's  degree (e.g., BA, AB, BS)  Occupational History  . Not on file  Social Needs  . Financial resource strain: Not on file  . Food insecurity:    Worry: Not on file    Inability: Not on file  . Transportation needs:    Medical: Not on file    Non-medical: Not on file  Tobacco Use  . Smoking status: Never Smoker  . Smokeless tobacco: Never Used  Substance and Sexual Activity  . Alcohol use: Not Currently    Frequency: Never    Comment: rare, maybe once every 5 years  . Drug use: Never  . Sexual activity: Not on file  Lifestyle  . Physical activity:    Days per week: Not on file    Minutes per session: Not on file  . Stress: Not on file  Relationships  . Social connections:    Talks on phone: Not on file    Gets together: Not on file    Attends religious service: Not on file    Active member of club or organization: Not on file    Attends meetings of clubs or organizations: Not on file    Relationship status: Not on file  . Intimate partner violence:    Fear of current or ex partner: Not on file    Emotionally abused: Not on file    Physically abused: Not on file    Forced sexual activity: Not on file  Other Topics Concern  . Not on file  Social History Narrative   Lives at home with husband and son   Right handed   Caffeine: mild consumption         ** Merged History Encounter **        Family History  Problem Relation Age of Onset  . Hypertension Mother   . Diabetes Mother   . Brain cancer Sister   . Breast cancer Sister   . Breast cancer Maternal Grandmother   . Lung cancer Paternal Grandmother   . COPD Maternal Aunt   . Breast cancer Maternal Aunt   . Seizures Daughter        grand mal, as a young child  . Seizures Daughter        petite mal, as a young child     Past Medical History:  Diagnosis Date  . Anxiety   . Complication of anesthesia   . Depression   . Family hx of colon cancer 11/19/2017  . Headache   . Lynch syndrome 11/19/2017  . PONV  (postoperative nausea and vomiting)   . Restless leg   . URI (upper respiratory infection)     Patient Active Problem List   Diagnosis Date Noted  . Lynch syndrome 11/19/2017  . Family hx of colon cancer 11/19/2017  . Anxiety   . Depression   . Restless leg     Past Surgical History:  Procedure Laterality Date  . ABDOMINAL HYSTERECTOMY     complete  . BILATERAL OOPHORECTOMY    . BREAST LUMPECTOMY Right   . CHOLECYSTECTOMY    . COLONOSCOPY N/A 02/18/2018   Procedure: COLONOSCOPY;  Surgeon: Rogene Houston, MD;  Location: [facility];  Service: Endoscopy;  Laterality: N/A;  8:30  . DILATION AND CURETTAGE OF UTERUS    . LAPAROSCOPIC GASTRIC BANDING     2008  . PARTIAL HYSTERECTOMY    . TONSILLECTOMY      Current Outpatient Medications  Medication Sig Dispense Refill  . gabapentin (NEURONTIN) 300 MG capsule Take 300 mg by mouth at bedtime. Can take TID PRN.  0  . traZODone (DESYREL) 50 MG tablet Take 50 mg by mouth at bedtime.    . cetirizine (ZYRTEC) 10 MG tablet Take 10 mg by mouth daily as needed for allergies.    Marland Kitchen escitalopram (LEXAPRO) 20 MG tablet Take 10 mg by mouth at bedtime.  0   No current facility-administered medications for this visit.     Allergies as of 12/28/2018 - Review Complete 12/28/2018  Allergen Reaction Noted  . Adhesive [tape] Other (See Comments) 02/11/2018    Vitals: BP 132/83 (BP Location: Right Arm, Patient Position: Sitting)   Pulse 70   Ht 5\' 2"  (1.575 m)   Wt 192 lb (87.1 kg)   BMI 35.12 kg/m  Last Weight:  Wt Readings from Last 1 Encounters:  12/28/18 192 lb (87.1 kg)   Last Height:   Ht Readings from Last 1 Encounters:  12/28/18 5\' 2"  (1.575 m)     Physical exam: Exam: Gen: NAD, teary                    CV: RRR, no MRG. No Carotid Bruits. No peripheral edema, warm, nontender Eyes: Conjunctivae clear without exudates or hemorrhage  Neuro: Detailed Neurologic Exam  Speech:    Speech is normal; fluent and  spontaneous with normal comprehension.  Cognition:    The patient is oriented to person, place, and time;     recent and remote memory intact;     language fluent;     normal attention, concentration,     fund of knowledge Cranial Nerves:    The pupils are equal, round, and reactive to light. The fundi are normal and spontaneous venous pulsations are present. Visual fields are full to finger confrontation. Extraocular movements are intact. Trigeminal sensation is intact and the muscles of mastication are normal. The face is symmetric. The palate elevates in the midline. Hearing intact. Voice is normal. Shoulder shrug is normal. The tongue has normal motion without fasciculations.   Coordination:    Normal finger to nose and heel to shin. Normal rapid alternating movements.   Gait:    Heel-toe and tandem gait are normal.   Motor Observation:    No asymmetry, no atrophy, and no involuntary movements noted. Tone:    Normal muscle tone.    Posture:    Posture is normal. normal erect    Strength:    Strength is V/V in the upper and lower limbs.      Sensation: intact to LT     Reflex Exam:  DTR's:    Deep tendon reflexes in the upper and lower extremities are normal bilaterally.   Toes:    The toes are downgoing bilaterally.   Clonus:    Clonus is absent.    Assessment/Plan:  48 year old with cognitive complaints. I think this is likely depression and anxiety or other mood disorders but needs thorough evaluation.  MRI of the brain w/wo contrast with an MS protocol Formal memory testing: [facility] in the office here 3-day ambulatory EEG Lab testing   Addendum 03/16/2019: 72 hour EEG was  normal every time she pressed the button and reported "confusion", 9 times in total. There were no related EEG events when she pushed the button. So the episodes of confusion she is reporting are not seizures. A few times we saw some slowing which could be benign such as drowsiness during  the day but the slowing is not epileptic (or epileptiform) in nature so this is not an abnormality we would see due to seizures. Again, the episodes are not epileptic.  Orders Placed This Encounter  Procedures  . MR BRAIN W WO CONTRAST  . B12 and Folate Panel  . Methylmalonic acid, serum  . RPR  . Thyroid Panel With TSH  . HIV Antibody (routine testing w rflx)  . Iron, TIBC and Ferritin Panel  . Comprehensive metabolic panel  . CBC  . Hepatitis C antibody  . Heavy metals, blood  . Vitamin B6  . Vitamin B1  . Sedimentation rate  . Ammonia  . Ambulatory referral to Neuropsychology  . EEG    Cc: Neale Burly, MD   Sarina Ill, MD  [facility] [address] [place], [place] [address]  Phone [phone] Fax [phone]

## 2018-12-30 ENCOUNTER — Telehealth: Payer: Self-pay | Admitting: Neurology

## 2018-12-30 NOTE — Telephone Encounter (Signed)
MR Brain w/wo contrast Dr. Ihor Dow Auth: [id number] (exp. 12/29/18 to 01/27/19). Patient is scheduled for 01/04/19

## 2019-01-01 LAB — CBC
Hematocrit: 38.7 % (ref 34.0–46.6)
Hemoglobin: 13 g/dL (ref 11.1–15.9)
MCH: 32.5 pg (ref 26.6–33.0)
MCHC: 33.6 g/dL (ref 31.5–35.7)
MCV: 97 fL (ref 79–97)
PLATELETS: 275 10*3/uL (ref 150–450)
RBC: 4 x10E6/uL (ref 3.77–5.28)
RDW: 11.8 % (ref 11.7–15.4)
WBC: 4.9 10*3/uL (ref 3.4–10.8)

## 2019-01-01 LAB — RPR: RPR: NONREACTIVE

## 2019-01-01 LAB — COMPREHENSIVE METABOLIC PANEL
ALK PHOS: 71 IU/L (ref 39–117)
ALT: 13 IU/L (ref 0–32)
AST: 17 IU/L (ref 0–40)
Albumin/Globulin Ratio: 1.8 (ref 1.2–2.2)
Albumin: 4.4 g/dL (ref 3.8–4.8)
BUN/Creatinine Ratio: 13 (ref 9–23)
BUN: 9 mg/dL (ref 6–24)
Bilirubin Total: 0.4 mg/dL (ref 0.0–1.2)
CALCIUM: 9.8 mg/dL (ref 8.7–10.2)
CHLORIDE: 101 mmol/L (ref 96–106)
CO2: 23 mmol/L (ref 20–29)
CREATININE: 0.68 mg/dL (ref 0.57–1.00)
GFR calc Af Amer: 121 mL/min/{1.73_m2} (ref >59)
GFR, EST NON AFRICAN AMERICAN: 105 mL/min/{1.73_m2} (ref >59)
GLUCOSE: 80 mg/dL (ref 65–99)
Globulin, Total: 2.4 g/dL (ref 1.5–4.5)
Potassium: 4.3 mmol/L (ref 3.5–5.2)
Sodium: 141 mmol/L (ref 134–144)
Total Protein: 6.8 g/dL (ref 6.0–8.5)

## 2019-01-01 LAB — IRON,TIBC AND FERRITIN PANEL
Ferritin: 47 ng/mL (ref 15–150)
IRON SATURATION: 51 % (ref 15–55)
IRON: 133 ug/dL (ref 27–159)
Total Iron Binding Capacity: 260 ug/dL (ref 250–450)
UIBC: 127 ug/dL — ABNORMAL LOW (ref 131–425)

## 2019-01-01 LAB — METHYLMALONIC ACID, SERUM: METHYLMALONIC ACID: 107 nmol/L (ref 0–378)

## 2019-01-01 LAB — HIV ANTIBODY (ROUTINE TESTING W REFLEX): HIV Screen 4th Generation wRfx: NONREACTIVE

## 2019-01-01 LAB — VITAMIN B1: Thiamine: 120 nmol/L (ref 66.5–200.0)

## 2019-01-01 LAB — HEAVY METALS, BLOOD
ARSENIC: 7 ug/L (ref 2–23)
Lead, Blood: NOT DETECTED ug/dL (ref 0–4)
Mercury: NOT DETECTED ug/L (ref 0.0–14.9)

## 2019-01-01 LAB — THYROID PANEL WITH TSH
Free Thyroxine Index: 1.7 (ref 1.2–4.9)
T3 Uptake Ratio: 23 % — ABNORMAL LOW (ref 24–39)
T4, Total: 7.6 ug/dL (ref 4.5–12.0)
TSH: 2.32 u[IU]/mL (ref 0.450–4.500)

## 2019-01-01 LAB — HEPATITIS C ANTIBODY

## 2019-01-01 LAB — VITAMIN B6: Vitamin B6: 5.3 ug/L (ref 2.0–32.8)

## 2019-01-01 LAB — SEDIMENTATION RATE: Sed Rate: 7 mm/hr (ref 0–32)

## 2019-01-01 LAB — B12 AND FOLATE PANEL
FOLATE: 13.8 ng/mL (ref >3.0)
Vitamin B-12: 651 pg/mL (ref 232–1245)

## 2019-01-01 LAB — AMMONIA: Ammonia: 39 ug/dL (ref 31–155)

## 2019-01-03 ENCOUNTER — Telehealth: Payer: Self-pay | Admitting: *Deleted

## 2019-01-03 NOTE — Telephone Encounter (Signed)
-----   Message from Melvenia Beam, MD sent at 01/03/2019  9:40 AM EST ----- Ferritin level is low-normal. It is 47. In restless leg syndrome we like it to be above 75. Iron therapy may help with restless legs.  In people with Restless Leg Syndrome we want it greater than 75. Taking iron supplement may significantly improve her RLS. Studies have shown that in patients whose serum ferritin was < 75 g/l, oral iron therapy (325 mg ferrous sulfate twice a day on an empty stomach) on average improved RLS symptom after 3 months.  Taking it with Vitamin C may help absorption and you can buy it in the store with vitamin c included in the pill. I recommend starting this.

## 2019-01-03 NOTE — Telephone Encounter (Signed)
I called the patient and discussed that her ferritin level is low normal at 47. Discussed that in RLS patient's may have significant improvement if the ferritin is above 75. I advised that Dr. Jaynee Eagles recommends that pt start ferrous sulfat 325 mg orally twice daily on an empty stomach. Advised this can be purchased OTC and that taking with Vitamin C may help absorption. She should be able to purchase the ferrous sulfate with vitamin C included in the tablet. She has an MRI tomorrow and we will call with results when they are available. Her questions were answered. Patient verbalized understanding and appreciation for the call.

## 2019-01-04 ENCOUNTER — Encounter: Payer: Self-pay | Admitting: *Deleted

## 2019-01-04 ENCOUNTER — Ambulatory Visit: Payer: BLUE CROSS/BLUE SHIELD

## 2019-01-04 DIAGNOSIS — R4189 Other symptoms and signs involving cognitive functions and awareness: Secondary | ICD-10-CM | POA: Diagnosis not present

## 2019-01-04 DIAGNOSIS — G9340 Encephalopathy, unspecified: Secondary | ICD-10-CM

## 2019-01-04 DIAGNOSIS — R413 Other amnesia: Secondary | ICD-10-CM | POA: Diagnosis not present

## 2019-01-04 DIAGNOSIS — R419 Unspecified symptoms and signs involving cognitive functions and awareness: Secondary | ICD-10-CM | POA: Diagnosis not present

## 2019-01-04 DIAGNOSIS — G40909 Epilepsy, unspecified, not intractable, without status epilepticus: Secondary | ICD-10-CM

## 2019-01-04 MED ORDER — GADOBENATE DIMEGLUMINE 529 MG/ML IV SOLN
18.0000 mL | Freq: Once | INTRAVENOUS | Status: AC | PRN
  Administered 2019-01-04: 18 mL via INTRAVENOUS

## 2019-01-05 ENCOUNTER — Telehealth: Payer: Self-pay | Admitting: *Deleted

## 2019-01-05 DIAGNOSIS — R413 Other amnesia: Secondary | ICD-10-CM | POA: Diagnosis not present

## 2019-01-05 NOTE — Telephone Encounter (Signed)
Fax busy several times. I called [facility] and informed the staff that MRI brain is normal. I confirmed fax number and will try to send again.

## 2019-01-05 NOTE — Telephone Encounter (Signed)
Faxed pt's MRI results to [facility] per their request.   Pt's appointment schedule with them:  01/05/2019 @ 11:00 - Clinical Interview 01/10/2019 @ 9:00- Neuropsychological Testing 01/26/2019 @ 10:00- Results Review

## 2019-01-10 DIAGNOSIS — R413 Other amnesia: Secondary | ICD-10-CM | POA: Diagnosis not present

## 2019-01-26 DIAGNOSIS — R413 Other amnesia: Secondary | ICD-10-CM | POA: Diagnosis not present

## 2019-01-26 DIAGNOSIS — Z733 Stress, not elsewhere classified: Secondary | ICD-10-CM | POA: Diagnosis not present

## 2019-01-26 DIAGNOSIS — R41844 Frontal lobe and executive function deficit: Secondary | ICD-10-CM | POA: Diagnosis not present

## 2019-01-27 ENCOUNTER — Other Ambulatory Visit: Payer: Self-pay

## 2019-01-27 ENCOUNTER — Ambulatory Visit: Payer: BLUE CROSS/BLUE SHIELD | Admitting: Neurology

## 2019-01-27 DIAGNOSIS — R410 Disorientation, unspecified: Secondary | ICD-10-CM | POA: Diagnosis not present

## 2019-01-27 DIAGNOSIS — R419 Unspecified symptoms and signs involving cognitive functions and awareness: Secondary | ICD-10-CM

## 2019-01-28 NOTE — Procedures (Signed)
   HISTORY: 48 years old female presented with memory loss, difficulty concentrating    TECHNIQUE:  This is a routine 16 channel EEG recording with one channel devoted to a limited EKG recording.  It was performed during wakefulness, drowsiness and asleep.  Hyperventilation and photic stimulation were performed as activating procedures.  There are minimum muscle and movement artifact noted.  Upon maximum arousal, posterior dominant waking rhythm consistent of rhythmic alpha range activity, with frequency of 10Hz . Activities are symmetric over the bilateral posterior derivations and attenuated with eye opening.  Hyperventilation produced mild/moderate buildup with higher amplitude and the slower activities noted.  Photic stimulation did not alter the tracing.  During EEG recording, patient developed drowsiness and entered sleep, sleep EEG demonstrated architecture, there were frontal centrally dominant vertex waves and symmetric sleep spindles noted.  During EEG recording, there was no epileptiform discharge noted.  EKG demonstrate sinus rhythm, with heart rate of 60 beats per minute  CONCLUSION: This is a  normal awake and asleep EEG.  There is no electrodiagnostic evidence of epileptiform discharge.  Marcial Pacas, M.D. Ph.D.  [facility] [place], [place] [address] Phone: [phone] Fax:      [phone]

## 2019-02-02 ENCOUNTER — Telehealth: Payer: Self-pay | Admitting: *Deleted

## 2019-02-02 ENCOUNTER — Other Ambulatory Visit: Payer: BLUE CROSS/BLUE SHIELD

## 2019-02-02 NOTE — Telephone Encounter (Signed)
Called pt & discussed EEG normal. With her agreement, we will order 72 hour ambulatory video EEG. Pt agreed. Her questions were answered. I advised to have her look for a call from [place] number. She verbalized appreciation.

## 2019-02-02 NOTE — Telephone Encounter (Signed)
Referral order pending MD signature. Also including copies of insurance card, routine EEG, and office note.

## 2019-02-02 NOTE — Telephone Encounter (Signed)
-----   Message from Melvenia Beam, MD sent at 01/31/2019 11:18 AM EDT ----- EEG is normal. If she agrees please order [facility] 72-hr eeg thanks

## 2019-02-04 NOTE — Telephone Encounter (Signed)
Ambulatory EEG referral faxed to [facility].Received a receipt of confirmation.

## 2019-02-07 NOTE — Telephone Encounter (Signed)
Received notice that [facility] ([facility]) has received the EEG referral and is being processed. [facility] stated we will receive another update once the pt has been successfully scheduled.

## 2019-02-28 DIAGNOSIS — R569 Unspecified convulsions: Secondary | ICD-10-CM | POA: Diagnosis not present

## 2019-03-02 DIAGNOSIS — R569 Unspecified convulsions: Secondary | ICD-10-CM | POA: Diagnosis not present

## 2019-03-15 ENCOUNTER — Telehealth: Payer: Self-pay | Admitting: Neurology

## 2019-03-15 NOTE — Telephone Encounter (Signed)
Pt called wanting to know if the results of the 72hr EEG test came in. Please advise.

## 2019-03-16 ENCOUNTER — Encounter: Payer: Self-pay | Admitting: Neurology

## 2019-03-16 DIAGNOSIS — R410 Disorientation, unspecified: Secondary | ICD-10-CM | POA: Insufficient documentation

## 2019-03-16 NOTE — Telephone Encounter (Signed)
Email with doxy link sent to [email or web address]

## 2019-03-16 NOTE — Telephone Encounter (Signed)
The EEG was normal every time she pressed the button. There were no related EEG events when she pushed the button. So the episodes of confusion she is reporting are not seizures. A few times we saw some slowing which could be benign such as drowsiness during the day but the slowing is not epileptic (or epileptiform) in nature so this is not an abnormality we would see due to seizures. Again, the episodes are not epileptic. If she wants a telephone call or video visit to discuss happy to do so thanks

## 2019-03-16 NOTE — Telephone Encounter (Signed)
I spoke with patient and discussed results. Patient would like to discuss further in a virtual visit. She consented to have the virtual visit and is aware a potential physical exam would be limited but it would allow her to see and hear Dr. Jaynee Eagles for the visit. Pt consented to have the visit filed with her insurance. Pt will be using a computer with webcam. Pt understands that [email or web address] advises to use the latest version of firefox, google chrome or safari. Pt scheduled for Monday 5/11 @ 11:30 AM. Her email is [email or web address]. Confirmed pt using 2 identifiers and updated her chart. No changes to PMH. meds updated. Pt understands she will receive an email with link. Advised pt to call back if she does not receive it. She verbalized appreciation.

## 2019-03-20 NOTE — Progress Notes (Addendum)
[facility]    Provider:  Dr Jaynee Eagles Referring Provider: Neale Burly, MD Primary Care Provider:  Neale Burly, MD  CC:  " Questionable cognitive deficit versus seizure activity, increasing memory deficit, difficulty processing information, sudden onset urinary incontinence, difficulty concentrating ". Mother with depression.   Virtual Visit via Video Note  I connected with Heather Gibbs on 03/21/19 at 11:30 AM EDT by a video enabled telemedicine application and verified that I am speaking with the correct person using two identifiers.  Location: Patient: home Provider: office   I discussed the limitations of evaluation and management by telemedicine and the availability of in person appointments. The patient expressed understanding and agreed to proceed.  Follow Up Instructions:    I discussed the assessment and treatment plan with the patient. The patient was provided an opportunity to ask questions and all were answered. The patient agreed with the plan and demonstrated an understanding of the instructions.   The patient was advised to call back or seek an in-person evaluation if the symptoms worsen or if the condition fails to improve as anticipated.  I provided 25 minutes of non-face-to-face time during this encounter.   Melvenia Beam, MD    Interval history 03/21/2019: She is tired all the time, she goes to bed between 10 and 11 and sleeps 8 hours. She does not feel refreshed when she wake [place], she wakes up exhausted. She has RLS and her husband says she kicks all night, she thrashes in the bed. It was suggested she has a sleep study in her form neuropsych eval. She snores. She wakes with morning headaches.   Addendum 03/16/2019: 72 hour EEG was normal every time she pressed the button and reported "confusion", 9 times in total. There were no related EEG events when she pushed the button. So the episodes of confusion she is reporting are not  seizures. A few times we saw some slowing which could be benign such as drowsiness during the day but the slowing is not epileptic (or epileptiform) in nature so this is not an abnormality we would see due to seizures. Again, the episodes are not epileptic.  HPI:  Heather Gibbs is a 48 y.o. female here as requested by provider Sherrie Sport, Samul Dada, MD for cognitive changes. 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 She started forgetting things at the [employer], she couldn't "get it together", she was under a lot of stress and was separated from her husband. Her husband is here today and also provides much information. She would leave work and drive home and become lost. Her friends would use a tracking app on her phone. She went to the ED and an MRI in [place] was performed which was normal. EEG, MRI, Carotid studies all normal. They thought it was psychiatric. She went back to work and was out again, she has to take leaves of absences. She has had a lot of trouble keeping up at work in various jobs, lots of trouble processing and staying organized. She has symptoms every day and has worsened, waxes and wanes, gets better then gets worse. She is crying today in the office. She tried one morning for 30 minutes to make a pancake and couldn't. Husband pays the bills, he does everything. She loses cash, she loses everything. She endorses depression and anxiety. Husband  feels it is progressing. She is having episodes of incontinence recently bu this was after cholecystectomy. No focal weakness. She does feel egenrally weak all ove but nothing focal. No numbness or tingling.  Great grandmother had dementia. She doesn't know her father's history. She has racing thoughts as well, insomnia, she has genetic marker for Lynch Syndrome, her brain doesn;t shut off. She desn't complete things,  she is very distractable. No other focal neurologic deficits, associated symptoms, inciting events or modifiable factors. No symptoms of sleep apnea.   Reviewed notes, labs and imaging from outside physicians, which showed:  Reviewed notes from Dr. Sherrie Sport.  Patient states she is having cognitive issues lately, having trouble remembering things, she cannot process tasks in order such as cooking a meal, she states that she feels nothing right in the head.  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  Reviewed physical exam which was normal.  Neurologic exam normal.  Anxiety noted.  Of note she is on multiple medications including Neurontin, Lexapro, trazodone, Topamax.  Review of Systems: Patient complains of symptoms per HPI as well as the following symptoms: Memory loss, confusion, headache, insomnia, restless legs, depression, anxiety, racing thoughts, fatigue. Pertinent negatives and positives per HPI. All others negative.   Social History   Socioeconomic History   Marital status: Married    Spouse name: Not on file   Number of children: 3   Years of education: 16   Highest education level: Bachelor's degree (e.g., BA, AB, BS)  Occupational History   Not on file  Social Needs   Financial resource strain: Not on file   Food insecurity:    Worry: Not on file    Inability: Not on file   Transportation needs:    Medical: Not on file    Non-medical: Not on file  Tobacco Use   Smoking status: Never Smoker   Smokeless tobacco: Never Used  Substance and Sexual Activity   Alcohol use: Not Currently    Frequency: Never    Comment: rare, maybe once every 5 years   Drug use: Never   Sexual activity: Not on file  Lifestyle   Physical activity:    Days per week: Not on file     Minutes per session: Not on file   Stress: Not on file  Relationships   Social connections:    Talks on phone: Not on file    Gets together: Not on file    Attends religious service: Not on file    Active member of club or organization: Not on file    Attends meetings of clubs or organizations: Not on file    Relationship status: Not on file   Intimate partner violence:    Fear of current or ex partner: Not on file    Emotionally abused: Not on file    Physically abused: Not on file    Forced sexual activity: Not on file  Other Topics Concern   Not on file  Social History Narrative   Lives at home with husband and son   Right handed   Caffeine: mild consumption         ** Merged History Encounter **        Family History  Problem Relation Age of Onset   Hypertension Mother  Diabetes Mother    Lung cancer Mother    Brain cancer Sister    Breast cancer Sister    Breast cancer Maternal Grandmother    Lung cancer Paternal Grandmother    COPD Maternal Aunt    Breast cancer Maternal Aunt    Seizures Daughter        grand mal, as a young child   Seizures Daughter        petite mal, as a young child    Chiari malformation Daughter     Past Medical History:  Diagnosis Date   Anxiety    Complication of anesthesia    Depression    Family hx of colon cancer 11/19/2017   Headache    Lynch syndrome 11/19/2017   PONV (postoperative nausea and vomiting)    Restless leg    URI (upper respiratory infection)     Patient Active Problem List   Diagnosis Date Noted   Confusion. 72 hour EEG normal during episodes of confusion no seizures on EEG. 03/16/2019   Lynch syndrome 11/19/2017   Family hx of colon cancer 11/19/2017   Anxiety    Depression    Restless leg     Past Surgical History:  Procedure Laterality Date   ABDOMINAL HYSTERECTOMY     complete   BILATERAL OOPHORECTOMY     BREAST LUMPECTOMY Right    CHOLECYSTECTOMY      COLONOSCOPY N/A 02/18/2018   Procedure: COLONOSCOPY;  Surgeon: Rogene Houston, MD;  Location: [facility];  Service: Endoscopy;  Laterality: N/A;  8:30   DILATION AND CURETTAGE OF UTERUS     LAPAROSCOPIC GASTRIC BANDING     2008   PARTIAL HYSTERECTOMY     TONSILLECTOMY      Current Outpatient Medications  Medication Sig Dispense Refill   cetirizine (ZYRTEC) 10 MG tablet Take 10 mg by mouth daily as needed for allergies.     Docusate Calcium (STOOL SOFTENER PO) Take 1 each by mouth daily.     escitalopram (LEXAPRO) 20 MG tablet Take 10 mg by mouth at bedtime.  0   ferrous sulfate 325 (65 FE) MG tablet Take 325 mg by mouth 2 (two) times daily with a meal.     gabapentin (NEURONTIN) 300 MG capsule Take 300 mg by mouth at bedtime. Can take TID PRN.  0   traZODone (DESYREL) 50 MG tablet Take 50 mg by mouth at bedtime.     No current facility-administered medications for this visit.     Allergies as of 03/21/2019 - Review Complete 03/16/2019  Allergen Reaction Noted   Adhesive [tape] Other (See Comments) 02/11/2018    Vitals: There were no vitals taken for this visit. Last Weight:  Wt Readings from Last 1 Encounters:  03/16/19 178 lb (80.7 kg)   Last Height:   Ht Readings from Last 1 Encounters:  03/16/19 5' 2.5" (1.588 m)     Physical exam: Exam: Gen: NAD, teary                    CV: RRR, no MRG. No Carotid Bruits. No peripheral edema, warm, nontender Eyes: Conjunctivae clear without exudates or hemorrhage  Neuro: Detailed Neurologic Exam  Speech:    Speech is normal; fluent and spontaneous with normal comprehension.  Cognition:    The patient is oriented to person, place, and time;     recent and remote memory intact;     language fluent;     normal attention, concentration,  fund of knowledge Cranial Nerves:    The pupils are equal, round, and reactive to light. The fundi are normal and spontaneous venous pulsations are present. Visual fields are  full to finger confrontation. Extraocular movements are intact. Trigeminal sensation is intact and the muscles of mastication are normal. The face is symmetric. The palate elevates in the midline. Hearing intact. Voice is normal. Shoulder shrug is normal. The tongue has normal motion without fasciculations.   Coordination:    Normal finger to nose and heel to shin. Normal rapid alternating movements.   Gait:    Heel-toe and tandem gait are normal.   Motor Observation:    No asymmetry, no atrophy, and no involuntary movements noted. Tone:    Normal muscle tone.    Posture:    Posture is normal. normal erect    Strength:    Strength is V/V in the upper and lower limbs.      Sensation: intact to LT     Reflex Exam:  DTR's:    Deep tendon reflexes in the upper and lower extremities are normal bilaterally.   Toes:    The toes are downgoing bilaterally.   Clonus:    Clonus is absent.    Assessment/Plan:  48 year old with cognitive complaints. I think depression and anxiety and stress contributory. However she may have a sleep disorder. Formal neurocog testing was overall normal but suggested a sleep study and her eeg sowed some bursts of slowing maybe drowsiness during the day.   Sleep evaluation: She is tired all the time, she goes to bed between 10 and 11 and sleeps 8 hours. She does not feel refreshed when she wake [place], she wakes up exhausted. She has RLS and her husband says she kicks all night, she thrashes in the bed. It was suggested she has a sleep study in her form neuropsych eval. She snores. She wakes with morning headaches 4 days a week.    MRI of the brain w/wo contrast with an MS protocol: normal Formal memory testing: [place]: Psychological stress. May benefit from Behavioral therapy, anti-depression and a sleep study recommended. Asked her to follow up with pcp for depression management. Also a lyme test was suggested, asked her to f/u with pcp. EEG in the office  here: normal 3-day ambulatory EEG: no epileptiform activity. Episodes of slowing maybe drowsiness Lab testing completed  Addendum 03/16/2019: 72 hour EEG was normal every time she pressed the button and reported "confusion", 9 times in total. There were no related EEG events when she pushed the button. So the episodes of confusion she is reporting are not seizures. A few times we saw some slowing which could be benign such as drowsiness during the day but the slowing is not epileptic (or epileptiform) in nature so this is not an abnormality we would see due to seizures. Again, the episodes are not epileptic.  Orders Placed This Encounter  Procedures   Ambulatory referral to Sleep Studies    Cc: Hasanaj, Samul Dada, MD   Sarina Ill, MD  [facility] [address] [place], [place] [address]  Phone [phone] Fax [phone]

## 2019-03-21 ENCOUNTER — Ambulatory Visit (INDEPENDENT_AMBULATORY_CARE_PROVIDER_SITE_OTHER): Payer: BLUE CROSS/BLUE SHIELD | Admitting: Neurology

## 2019-03-21 ENCOUNTER — Other Ambulatory Visit: Payer: Self-pay

## 2019-03-21 ENCOUNTER — Encounter: Payer: Self-pay | Admitting: Neurology

## 2019-03-21 DIAGNOSIS — R51 Headache: Secondary | ICD-10-CM

## 2019-03-21 DIAGNOSIS — G4719 Other hypersomnia: Secondary | ICD-10-CM

## 2019-03-21 DIAGNOSIS — R0683 Snoring: Secondary | ICD-10-CM | POA: Diagnosis not present

## 2019-03-21 DIAGNOSIS — R519 Headache, unspecified: Secondary | ICD-10-CM

## 2019-03-22 ENCOUNTER — Telehealth: Payer: Self-pay | Admitting: Neurology

## 2019-03-22 ENCOUNTER — Encounter: Payer: Self-pay | Admitting: Neurology

## 2019-03-22 NOTE — Telephone Encounter (Signed)
Due to current COVID 19 pandemic, our office is severely reducing in office visits, in order to minimize the risk to our patients and healthcare providers.    Pt understands that although there may be some limitations with this type of visit, we will take all precautions to reduce any security or privacy concerns.  Pt understands that this will be treated like an in office visit and we will file with pt's insurance, and there may be a patient responsible charge related to this service.  Pt's email is [email or web address]. Pt will be using [employer] for their virtual visit. Pt understands that the nurse will be calling to go over pt's chart.

## 2019-03-22 NOTE — Addendum Note (Signed)
Addended by: Darleen Crocker on: 03/22/2019 09:36 AM   Modules accepted: Orders

## 2019-03-24 ENCOUNTER — Other Ambulatory Visit: Payer: Self-pay

## 2019-03-24 ENCOUNTER — Ambulatory Visit (INDEPENDENT_AMBULATORY_CARE_PROVIDER_SITE_OTHER): Payer: BLUE CROSS/BLUE SHIELD | Admitting: Neurology

## 2019-03-24 ENCOUNTER — Telehealth: Payer: Self-pay | Admitting: *Deleted

## 2019-03-24 DIAGNOSIS — G4719 Other hypersomnia: Secondary | ICD-10-CM

## 2019-03-24 DIAGNOSIS — R51 Headache: Secondary | ICD-10-CM | POA: Diagnosis not present

## 2019-03-24 DIAGNOSIS — G2581 Restless legs syndrome: Secondary | ICD-10-CM | POA: Diagnosis not present

## 2019-03-24 DIAGNOSIS — F5103 Paradoxical insomnia: Secondary | ICD-10-CM

## 2019-03-24 DIAGNOSIS — R419 Unspecified symptoms and signs involving cognitive functions and awareness: Secondary | ICD-10-CM | POA: Diagnosis not present

## 2019-03-24 DIAGNOSIS — R519 Headache, unspecified: Secondary | ICD-10-CM

## 2019-03-24 DIAGNOSIS — F518 Other sleep disorders not due to a substance or known physiological condition: Secondary | ICD-10-CM

## 2019-03-24 NOTE — Telephone Encounter (Signed)
Dr. Jaynee Eagles, when would you like pt to schedule her next appointment with you? FYI She has an appt with Dr. Brett Fairy today.

## 2019-03-24 NOTE — Telephone Encounter (Signed)
Noted  

## 2019-03-24 NOTE — Telephone Encounter (Signed)
I would not schedule anything with me right now. Wait and see what the sleep evaluation shows. And if she has sleep apnea, that has to be treated. thanks

## 2019-03-24 NOTE — Progress Notes (Signed)
Virtual Visit via Video Note  I connected with Heather Gibbs on 03/24/19 at 11:00 AM EDT by a video enabled telemedicine application and verified that I am speaking with the correct person using two identifiers.  Location: Patient: at her office, group home Provider: [facility]   I discussed the limitations of evaluation and management by telemedicine and the availability of in person appointments. The patient expressed understanding and agreed to proceed.   SLEEP MEDICINE CLINIC   Provider:  Larey Seat, MD  Primary Care Physician:  Neale Burly, MD   Referring Provider: Sarina Ill. MD  HPI:  CECIA Gibbs is a 48 y.o. female Patient , seen here as in a referral from Dr. Jaynee Eagles.  Chief complaint according to patient : Mrs. still has been referred by my colleague Dr. Sarina Ill who reports that the patient had some spells for which she originally had a video and a blood encounter on 21 Mar 2019, there were concerns about cognitive deficit, seizure activity, sudden onset of urinary incontinence.  The referral to me is because of a concern of excessive daytime sleepiness.  I would like to add that the patient had an extensive EEG work-up and was referred for cognitive changes since 2017.  She had MRIs, EEGs, carotid studies, psychiatric evaluations, all listed in Dr. Cathren Laine referral.     Sleep and medical history : The patient reports that she needs medication to calm her legs down, my legs are going crazy" she was given Requip which failed but help with sleep she feels now a little bit of foggy minded but overall improved.  She had tried trazodone and gabapentin.  Her MRI, Dopplers were normal- EEG was normal for 72 hours .  She is to be evaluated for excessive daytime sleepiness.  She does not indicate any sleep attacks rather confusion.  Family medical and sleep history: History in the maternal family of depression and dementia.    Social history: The patient lives at home  with husband and son, she is right-handed, she is a non-smoker, nondrinker. Small residential Group home provider.    Sleep habits are as follows: Dinnertime is 6 PM, there are 6 residents in the group home that she and her husband are eating.  Her bedtime is between 1030 and 11 PM, she takes a mixture of trazodone and gabapentin but still lies awake for hours.  She states that she has to pull the blankets over her head, creating some kind of cocoon before she can sleep she does not use earplugs, she does not use a sleep mask. Once asleep which is usually after midnight she wakes up from nocturia or leg movements but describes neither pain nor palpitations.  She can usually sleep only 2 hours on block.  Nocturia once or twice at night feeling thirsty after each break, many dreams but not necessarily visit were also interrupting her sleep.  She lies awake each time 20 to 30 minutes and when she falls asleep again she can sleep another 2 hours.  She rises between 630 and 7 AM but she estimates her own total sleep time to be between 6 and 7 hours.   By her own description she can only sleep 5 hours or little more-  But she does not take daytime naps ( chief concern for referral was EDS) . She also insists that she has neither been snoring nor has there been any witnessed apnea - yet she never wakes up refreshed . Some  days she wakes with a headache, not woken by the headaches.   Review of Systems: Out of a complete 14 system review, the patient complains of only the following symptoms, and all other reviewed systems are negative. She is to be evaluated for excessive daytime sleepiness.  She does not indicate any sleep attacks rather confusion.  How likely are you to doze in the following situations: 0 = not likely, 1 = slight chance, 2 = moderate chance, 3 = high chance  Sitting and Reading? Watching Television? Sitting inactive in a public place (theater or meeting)? Lying down in the afternoon when  circumstances permit? Sitting and talking to someone? Sitting quietly after lunch without alcohol? In a car, while stopped for a few minutes in traffic? As a passenger in a car for an hour without a break?  Total = 0/24   Social History   Socioeconomic History  . Marital status: Married    Spouse name: Not on file  . Number of children: 3  . Years of education: 16  . Highest education level: Bachelor's degree (e.g., BA, AB, BS)  Occupational History  . Not on file  Social Needs  . Financial resource strain: Not on file  . Food insecurity:    Worry: Not on file    Inability: Not on file  . Transportation needs:    Medical: Not on file    Non-medical: Not on file  Tobacco Use  . Smoking status: Never Smoker  . Smokeless tobacco: Never Used  Substance and Sexual Activity  . Alcohol use: Not Currently    Frequency: Never    Comment: rare, maybe once every 5 years  . Drug use: Never  . Sexual activity: Not on file  Lifestyle  . Physical activity:    Days per week: Not on file    Minutes per session: Not on file  . Stress: Not on file  Relationships  . Social connections:    Talks on phone: Not on file    Gets together: Not on file    Attends religious service: Not on file    Active member of club or organization: Not on file    Attends meetings of clubs or organizations: Not on file    Relationship status: Not on file  . Intimate partner violence:    Fear of current or ex partner: Not on file    Emotionally abused: Not on file    Physically abused: Not on file    Forced sexual activity: Not on file  Other Topics Concern  . Not on file  Social History Narrative   Lives at home with husband and son   Right handed   Caffeine: mild consumption         ** Merged History Encounter **        Family History  Problem Relation Age of Onset  . Hypertension Mother   . Diabetes Mother   . Lung cancer Mother   . Brain cancer Sister   . Breast cancer Sister   .  Breast cancer Maternal Grandmother   . Lung cancer Paternal Grandmother   . COPD Maternal Aunt   . Breast cancer Maternal Aunt   . Seizures Daughter        grand mal, as a young child  . Seizures Daughter        petite mal, as a young child   . Chiari malformation Daughter     Past Medical History:  Diagnosis Date  .  Anxiety   . Complication of anesthesia   . Depression   . Family hx of colon cancer 11/19/2017  . Headache   . Lynch syndrome 11/19/2017  . PONV (postoperative nausea and vomiting)   . Restless leg   . URI (upper respiratory infection)     Past Surgical History:  Procedure Laterality Date  . ABDOMINAL HYSTERECTOMY     complete  . BILATERAL OOPHORECTOMY    . BREAST LUMPECTOMY Right   . CHOLECYSTECTOMY    . COLONOSCOPY N/A 02/18/2018   Procedure: COLONOSCOPY;  Surgeon: Rogene Houston, MD;  Location: [facility];  Service: Endoscopy;  Laterality: N/A;  8:30  . DILATION AND CURETTAGE OF UTERUS    . LAPAROSCOPIC GASTRIC BANDING     2008  . PARTIAL HYSTERECTOMY    . TONSILLECTOMY      Current Outpatient Medications  Medication Sig Dispense Refill  . cetirizine (ZYRTEC) 10 MG tablet Take 10 mg by mouth daily as needed for allergies.    Mariane Baumgarten Calcium (STOOL SOFTENER PO) Take 1 each by mouth daily.    Marland Kitchen escitalopram (LEXAPRO) 20 MG tablet Take 10 mg by mouth at bedtime.  0  . ferrous sulfate 325 (65 FE) MG tablet Take 325 mg by mouth 2 (two) times daily with a meal.    . gabapentin (NEURONTIN) 300 MG capsule Take 300 mg by mouth at bedtime. Can take TID PRN.  0  . traZODone (DESYREL) 50 MG tablet Take 50 mg by mouth at bedtime.     No current facility-administered medications for this visit.     Allergies as of 03/24/2019 - Review Complete 03/22/2019  Allergen Reaction Noted  . Adhesive [tape] Other (See Comments) 02/11/2018    Vitals: There were no vitals taken for this visit. Last Weight:  Wt Readings from Last 1 Encounters:  03/16/19 178 lb  (80.7 kg)   JIR:CVELF is no height or weight on file to calculate BMI.       Last Height:   Ht Readings from Last 1 Encounters:  03/16/19 5' 2.5" (1.588 m)    OBSERVATION of VIDEO encounter:   General: The patient is awake, alert and appears not in acute distress.   The patient is cooperative, pleasant and eloquent.  The patient is well groomed. Head: Normocephalic, atraumatic. Neck with full ROM. Mallampati garde 2- neck 13 " circumference.    Nasal airflow patent,  Retrognathia is not seen.  Cardiovascular:   without distended neck veins. Respiratory: able to he old her breath for 50 seconds (!)> Skin:  Without evidence of edema, or rash Trunk  The patient's posture is erect.  Neurologic exam : The patient is awake and alert, oriented to place and time.    Attention span & concentration ability appears normal.  Speech is fluent,  without  dysarthria, dysphonia or aphasia.  Mood and affect are appropriate.  Cranial nerves: Pupils are equal  Extraocular movements  in vertical and horizontal planes intact  Facial motor strength is symmetric and tongue and uvula move midline. Shoulder shrug was symmetrical.   Motor exam:   Normal muscle bulk and symmetric ROM in upper extremities. Sensory:  Fine touch, pinprick and vibration were tested in all extremities. Proprioception tested in the upper extremities was normal. Coordination:  alternating movements in the fingers/hands were normal.   Assessment and Plan: I discussed with Mrs. [name] my concerns: She is neither describing excessive daytime sleepiness, nor apnea, neither snoring to be present.  I would like to order an attended sleep study in the near future.  A home sleep test is directed to screen for apnea and does not give me reliable data about frequency of arousals or sleep architecture.    I wonder if she underestimates the total sleep time . I also wonder if there is a sleep perception disturbance, as she does not take  daytime naps and really is not spontaneously falling asleep in spite of what could be a sleep deprived state.   I also suggested that we double her trazodone currently from 50mg  to 2 tablets at night,  Gabapentin stays at 300 mg -  this will hopefully allow [place] to capture more sleep time.  Attended sleep studies will also make it  possible to assess restless legs or sleep myoclonus by using an expanded EEG montage.    The patient agreed with this assessment.  I ordered an attended sleep study with extended montage.   Follow Up Instructions: I ordered an attended sleep study with extended montage. CD     I discussed the assessment and treatment plan with the patient. The patient was provided an opportunity to ask questions and all were answered. The patient agreed with the plan and demonstrated an understanding of the instructions.   The patient was advised to call back or seek an in-person evaluation if the symptoms worsen or if the condition fails to improve as anticipated.  I provided 35 minutes of non-face-to-face time during this encounter.   Asencion Gibbs Doristine Shehan, MD .     No orders of the defined types were placed in this encounter.    No follow-ups on file.   Larey Seat, MD 6/64/4034, 74:25 AM  Certified in Neurology by ABPN Certified in [place] by [facility] [address], [place], [place] [address]            History of Present Illness:    Observations/Objective:

## 2019-04-01 ENCOUNTER — Encounter: Payer: Self-pay | Admitting: Neurology

## 2019-04-01 DIAGNOSIS — R419 Unspecified symptoms and signs involving cognitive functions and awareness: Secondary | ICD-10-CM | POA: Insufficient documentation

## 2019-04-01 DIAGNOSIS — F5103 Paradoxical insomnia: Secondary | ICD-10-CM | POA: Insufficient documentation

## 2019-04-01 DIAGNOSIS — R519 Headache, unspecified: Secondary | ICD-10-CM | POA: Insufficient documentation

## 2019-04-01 DIAGNOSIS — G2581 Restless legs syndrome: Secondary | ICD-10-CM | POA: Insufficient documentation

## 2019-04-01 DIAGNOSIS — F518 Other sleep disorders not due to a substance or known physiological condition: Secondary | ICD-10-CM | POA: Insufficient documentation

## 2019-04-01 MED ORDER — TRAZODONE HCL 50 MG PO TABS: 100.0000 mg | ORAL_TABLET | Freq: Every day | ORAL | 3 refills | Status: DC

## 2019-04-01 NOTE — Patient Instructions (Signed)
Sleep Study The sleep study consists of a recording of your brain waves (EEG). Breathing, heart rate and rhythm (ECG), oxygen level, eye movement, and leg movement.  The technician will glue or or paste several electrodes to your scalp, face, chest and legs.  You will have belts around your chest and abdomen to record breathing and a finger clasp to check blood oxygen levels.  A tube at your mouth and nose will detect airflow.  There are no needle sticks or painful procedures of any sort.  You will have your own room, and we will make every effort to attend to your comfort and privacy.  Please prepare for your study by the following steps:   Please avoid coffee, tea, soda, chocolate and other caffeine foods or beverages after 12:00 noon on the day of your sleep study.   You must arrive with clean (no oils), conditioners or make up, and please make sure that you wash your hair to ensure that your hair and scalp are clean, dry and free of any hair extensions on the day of your study.  This will help to get a good reading of study.  Please try not to nap on the day of your study.  Please bring a list of all your medications.  Bring any medications that you might need during the time you are within the laboratory, including insulin, sleeping pills, pain medication and anxiety medications.  Bring snacks, water or juice  Please bring clothes to sleep in and your normal overnight bag.  Please leave valuable at home, as we will not be responsible for any lost items.  If you have any further questions, please feel free to call our office. Thank you! Please call our office 24 h( one business day)  in advance to cancel or reschedule to avoid a $ 200.00 early cancel or no show fee.

## 2019-04-08 DIAGNOSIS — Z6831 Body mass index (BMI) 31.0-31.9, adult: Secondary | ICD-10-CM | POA: Diagnosis not present

## 2019-04-08 DIAGNOSIS — J029 Acute pharyngitis, unspecified: Secondary | ICD-10-CM | POA: Diagnosis not present

## 2019-05-10 ENCOUNTER — Ambulatory Visit (INDEPENDENT_AMBULATORY_CARE_PROVIDER_SITE_OTHER): Payer: BC Managed Care – PPO | Admitting: Neurology

## 2019-05-10 DIAGNOSIS — R519 Headache, unspecified: Secondary | ICD-10-CM

## 2019-05-10 DIAGNOSIS — G4761 Periodic limb movement disorder: Secondary | ICD-10-CM

## 2019-05-10 DIAGNOSIS — R0683 Snoring: Secondary | ICD-10-CM

## 2019-05-10 DIAGNOSIS — G4719 Other hypersomnia: Secondary | ICD-10-CM

## 2019-05-10 DIAGNOSIS — G4710 Hypersomnia, unspecified: Secondary | ICD-10-CM

## 2019-05-10 DIAGNOSIS — G2581 Restless legs syndrome: Secondary | ICD-10-CM

## 2019-05-10 DIAGNOSIS — R419 Unspecified symptoms and signs involving cognitive functions and awareness: Secondary | ICD-10-CM

## 2019-05-16 DIAGNOSIS — G4761 Periodic limb movement disorder: Secondary | ICD-10-CM | POA: Insufficient documentation

## 2019-05-16 DIAGNOSIS — R0683 Snoring: Secondary | ICD-10-CM | POA: Insufficient documentation

## 2019-05-16 NOTE — Procedures (Signed)
PATIENT'S NAME:  Heather Gibbs, Heather Gibbs DOB:      1971-08-12      MR#:    [id number]     DATE OF RECORDING: 05/10/2019 REFERRING M.D.:  Sarina Ill MD Study Performed:   Expanded EEG/ REM Montage Polysomnogram HISTORY:  Heather Gibbs is a 48 year old female and has been referred by my colleague Dr. Sarina Ill who reports that the patient had some spells for which she originally had a video guided encounter on 21 Mar 2019. There were concerns about a possible cognitive deficit, possible seizure activity, and sudden onset of urinary incontinence.  The referral to me is because of reported excessive daytime sleepiness.  I would like to add that the patient had an extensive EEG work-up and was referred for cognitive changes in 2017.  She had MRIs, EEGs, carotid studies, psychiatric evaluations, all listed in Dr. Cathren Laine referral.  The patient reports that she needs medication to calm her legs down: " My legs are going crazy". She was given Requip which failed but helped with sleep, albeit causing a little bit of foggy mind.  She had tried trazodone and gabapentin.  Her MRI, Doppler and EEG were normal (EEG 72 hours).  She is to be evaluated for excessive daytime sleepiness.  She does not indicate any sleep attacks rather confusion  By her own description she can only sleep 5 hours or little more-  But she does not take daytime naps ( chief concern for referral was EDS) .She also insists that she has neither been snoring nor has there been any witnessed apnea - yet she never wakes up refreshed . Some days she wakes with a headache, but is not woken by the headaches.  The patient endorsed the Epworth Sleepiness Scale at 0 points.   The patient's weight 179 pounds with a height of 62 (inches), resulting in a BMI of 32.9 kg/m2. The patient's neck circumference measured 13 inches.  CURRENT MEDICATIONS: Zyrtec, Lexapro, Neurontin, Desyrel   PROCEDURE:  This is a multichannel digital polysomnogram utilizing the Somnostar  11.2 system.  Electrodes and sensors were applied and monitored per AASM Specifications.   EEG, EOG, Chin and Limb EMG, were sampled at 200 Hz.  ECG, Snore and Nasal Pressure, Thermal Airflow, Respiratory Effort, CPAP Flow and Pressure, Oximetry was sampled at 50 Hz. Digital video and audio were recorded.      BASELINE STUDY: Lights Out time was at 22:49 and Lights On time at 05:03 AM.  Total recording time (TRT) was 374.5 minutes, with a total sleep time (TST) of 279.5 minutes.  The patient's sleep latency was 28.5 minutes.  REM latency was 114 minutes. The sleep efficiency was 74.6 %.     SLEEP ARCHITECTURE: WASO (Wake after sleep onset) was 66 minutes.  There were 8.5 minutes in Stage N1, 49 minutes Stage N2, 174 minutes Stage N3 and 48 minutes in Stage REM.  The percentage of Stage N1 was 3.0%, Stage N2 was 17.5%, Stage N3 was 62.3% and Stage R (REM sleep) was 17.2%.     RESPIRATORY ANALYSIS:  There were a total of 13 respiratory events:  3 obstructive apneas, 0 central apneas and 0 mixed apneas with a total of 3 apneas and an apnea index (AI) of .6 /hour. There were 10 hypopneas with a hypopnea index of 2.1 /hour.    The total APNEA/HYPOPNEA INDEX (AHI) was 2.8 /hour.  13 events occurred in REM sleep and 0 events in NREM. The REM AHI was 16.25 /  hour, versus a non-REM AHI of 0. The patient spent 0 minutes of total sleep time in the supine position and 280 minutes in non-supine. The supine AHI was 0.0 versus a non-supine AHI of 2.7.  OXYGEN SATURATION & C02:  The Wake baseline 02 saturation was 98%, with the lowest being 70%. Time spent below 89% saturation equaled 5 minutes. The arousals were noted as: 23 were spontaneous, 10 were associated with PLMs, and 3 were associated with respiratory events.  The patient had a total of 68 Periodic Limb Movements.  The Periodic Limb Movement (PLM) index was 14.6 and the PLM Arousal index was 2.1/hour. PLMs persisted in REM sleep(!).  Audio and video analysis  did not show any abnormal or unusual movements, behaviors, phonations or vocalizations.  Loud Snoring was noted. EKG was in keeping with normal sinus rhythm (NSR).   IMPRESSION:  1. Overall degree of sleep apnea is too mild to warrant intervention. Snoring was persistently noted in lateral sleep position and intermittently loud.  2. Periodic Limb Movement Disorder (PLMD) were noted to persist into REM sleep. 3. Primary Snoring.  RECOMMENDATIONS:  1. The sleep architecture was especially impressive for high amount of N 3 sleep, likely induced by doxepin or similar medication.  2. Overall mild sleep disordered breathing and REM dependent sleep apnea with snoring warrant no CPAP intervention. Consider ENT referral for snoring evaluation.  3. PLMs in REM sleep are considered indicators for REM BD.  4. I found no explanation for the patient's morning headaches.     I certify that I have reviewed the entire raw data recording prior to the issuance of this report in accordance with the Standards of Accreditation of the American Academy of Sleep Medicine (AASM)  Larey Seat, MD     05-16-2019  Diplomat, American Board of Psychiatry and Neurology  Diplomat, American Board of [place] Director, [employer] [facility], [employer] of Sleep Medicine

## 2019-05-17 ENCOUNTER — Telehealth: Payer: Self-pay | Admitting: Neurology

## 2019-05-17 NOTE — Telephone Encounter (Signed)
-----   Message from Larey Seat, MD sent at 05/16/2019  4:46 PM EDT ----- IMPRESSION:   1. Overall degree of sleep apnea is too mild to warrant  intervention. Snoring was persistently noted in lateral sleep  position and intermittently loud.  2. Periodic Limb Movement Disorder (PLMD) were noted to persist  into REM sleep.  3. Primary Snoring.   RECOMMENDATIONS:   1. The sleep architecture was especially impressive for high  amount of N 3 sleep, likely induced by doxepin or similar  medication.  2. Overall mild sleep disordered breathing and REM dependent  sleep apnea with snoring warrant no CPAP intervention. Consider  ENT referral for snoring evaluation.  3. PLMs in REM sleep are considered indicators for REM BD. This may be at an early stage .  4. I found no explanation for the patient's morning headaches.

## 2019-05-17 NOTE — Telephone Encounter (Signed)
Called the patient and discussed the sleep study results. Reviewed the results did not show a reason behind the morning headaches. Advised the pt of the PLM's noted and potential of REM BD. Pt mentions that about once a week she has noticed somethin things happening in her sleep at night time. Advised the pt to schedule a follow up and scheduled that for 07/12/19 for the pt to come in and discuss some of the things that she has noted in sleep. Patient describes things that sound similar to restless leg syndrome but then she has examples of waking up and she feels like she is awake but cant tell if she is asleep vs wake state. Pt verbalized understanding of arriving 30 min prior to apt scheduled.

## 2019-06-06 DIAGNOSIS — M542 Cervicalgia: Secondary | ICD-10-CM | POA: Diagnosis not present

## 2019-06-06 DIAGNOSIS — M546 Pain in thoracic spine: Secondary | ICD-10-CM | POA: Diagnosis not present

## 2019-06-17 DIAGNOSIS — N3946 Mixed incontinence: Secondary | ICD-10-CM | POA: Diagnosis not present

## 2019-06-21 DIAGNOSIS — M545 Low back pain: Secondary | ICD-10-CM | POA: Diagnosis not present

## 2019-06-21 DIAGNOSIS — M546 Pain in thoracic spine: Secondary | ICD-10-CM | POA: Diagnosis not present

## 2019-06-23 ENCOUNTER — Encounter (INDEPENDENT_AMBULATORY_CARE_PROVIDER_SITE_OTHER): Payer: Self-pay | Admitting: *Deleted

## 2019-06-23 ENCOUNTER — Ambulatory Visit (INDEPENDENT_AMBULATORY_CARE_PROVIDER_SITE_OTHER): Payer: 59 | Admitting: Nurse Practitioner

## 2019-06-23 ENCOUNTER — Telehealth (INDEPENDENT_AMBULATORY_CARE_PROVIDER_SITE_OTHER): Payer: Self-pay | Admitting: *Deleted

## 2019-06-23 ENCOUNTER — Encounter (INDEPENDENT_AMBULATORY_CARE_PROVIDER_SITE_OTHER): Payer: Self-pay | Admitting: Nurse Practitioner

## 2019-06-23 ENCOUNTER — Other Ambulatory Visit: Payer: Self-pay

## 2019-06-23 VITALS — BP 114/78 | HR 74 | Temp 98.2°F | Resp 18 | Ht 63.0 in | Wt 187.3 lb

## 2019-06-23 DIAGNOSIS — R1084 Generalized abdominal pain: Secondary | ICD-10-CM | POA: Insufficient documentation

## 2019-06-23 DIAGNOSIS — Z800 Family history of malignant neoplasm of digestive organs: Secondary | ICD-10-CM

## 2019-06-23 DIAGNOSIS — Z1509 Genetic susceptibility to other malignant neoplasm: Secondary | ICD-10-CM

## 2019-06-23 DIAGNOSIS — R109 Unspecified abdominal pain: Secondary | ICD-10-CM | POA: Insufficient documentation

## 2019-06-23 MED ORDER — OMEPRAZOLE 20 MG PO CPDR: 20.0000 mg | DELAYED_RELEASE_CAPSULE | Freq: Every day | ORAL | 3 refills | Status: DC

## 2019-06-23 MED ORDER — SUPREP BOWEL PREP KIT 17.5-3.13-1.6 GM/177ML PO SOLN: 1.0000 | Freq: Once | ORAL | 0 refills | Status: AC

## 2019-06-23 NOTE — Telephone Encounter (Signed)
Patient needs suprep 

## 2019-06-23 NOTE — Progress Notes (Signed)
Subjective:    Patient ID: Heather Gibbs, female    DOB: [birth year], 48 y.o.   MRN: [id number]  HPI  Heather Gibbs is a 48 year old female with a past medical history significant for anxiety, depression, and a genetic abnormality for  Lynch syndrome.  S/P lap band surgery 2008. Family history significant for colon carcinoma in paternal grandfather who was in his 82s.  Father had colon carcinoma in his 28s. Her sister had surgery for colon carcinoma in her 11s and is doing fine in her 53s. She presents today to schedule an EGD and colonoscopy. She complains of having constipation. She is passing pellet like stool every 3 to 4 days.  No rectal bleeding or melena.  She complains of having upper and lower abdominal pain for the past year. No nausea, vomiting or heartburn. She underwent lap band surgery in 2007. The lap band has been deflated for several years. She would like to have the lap band removed sometime in the near future. She has restless leg syndrome treated with iron which worsened her constipation.  She has stopped the iron but continues to have constipation. Since this summer, she developed episodes of frequent loose to watery diarrhea which occurrs after being outside in the summer heat.  No bloody diarrhea.  No acute abdominal pain during this these episodes. No sweats or flushing. No recent antibiotics.  She infrequently takes NSAIDs, possibly twice monthly. Her most recent colonoscopy was 02/18/2018 completed by Dr. Melony Overly with moderate sedation which identified diverticulosis in the cecum and internal hemorrhoids, no polyps. Recall colonoscopy in 2 years was recommended. Her most recent EGD was plated by Dr. Mariana Arn 12/28/2015  was normal. Colonoscopy 12/28/2015: Normal. She is concerned regarding her increased risk for GI malignancy therefore she wishes to proceed with an EGD and colonoscopy.    Past Medical History:  Diagnosis Date  . Anxiety   . Complication of anesthesia   .  Depression   . Family hx of colon cancer 11/19/2017  . Headache   . Lynch syndrome 11/19/2017  . PONV (postoperative nausea and vomiting)   . Restless leg   . URI (upper respiratory infection)    Past Surgical History:  Procedure Laterality Date  . ABDOMINAL HYSTERECTOMY     complete  . BILATERAL OOPHORECTOMY    . BREAST LUMPECTOMY Right   . CHOLECYSTECTOMY    . COLONOSCOPY N/A 02/18/2018   Procedure: COLONOSCOPY;  Surgeon: Rogene Houston, MD;  Location: [facility];  Service: Endoscopy;  Laterality: N/A;  8:30  . DILATION AND CURETTAGE OF UTERUS    . LAPAROSCOPIC GASTRIC BANDING     2008  . PARTIAL HYSTERECTOMY    . TONSILLECTOMY     Current Outpatient Medications on File Prior to Visit  Medication Sig Dispense Refill  . cetirizine (ZYRTEC) 10 MG tablet Take 10 mg by mouth daily as needed for allergies.    Mariane Baumgarten Calcium (STOOL SOFTENER PO) Take 1 each by mouth daily.    Marland Kitchen escitalopram (LEXAPRO) 20 MG tablet Take 10 mg by mouth at bedtime.  0  . ferrous sulfate 325 (65 FE) MG tablet Take 325 mg by mouth 2 (two) times daily with a meal.    . gabapentin (NEURONTIN) 300 MG capsule Take 300 mg by mouth at bedtime. Can take TID PRN.  0  . traZODone (DESYREL) 50 MG tablet Take 2 tablets (100 mg total) by mouth at bedtime. 60 tablet 3   No current  facility-administered medications on file prior to visit.    Allergies  Allergen Reactions  . Adhesive [Tape] Other (See Comments)    Blistering   Review of Systems  See HPI, all other systems reviewed and are negative.    Objective:   Physical Exam  BP 114/78   Pulse 74   Temp 98.2 F (36.8 C) (Oral)   Resp 18   Ht 5\' 3"  (1.6 m)   Wt 187 lb 4.8 oz (85 kg)   BMI 33.18 kg/m   General: 48 year old well-developed female in no acute distress. Eyes: Sclera nonicteric, conjunctiva pink. Neck: Supple, no thyromegaly or lymphadenopathy. Heart: Regular rate and rhythm, no murmurs. Lungs: Breath sounds clear throughout.  Abdomen: Soft, moderate tenderness throughout the upper and lower abdomen and periumbilical area without rebound or guarding.  Palpable lap band to the central upper abdominal area.  No organomegaly.  Positive bowel sounds x4 quads. Extremities: No edema. Neuro: Alert and oriented x4, no focal deficits. Skin: No rash or lesions noted.    Assessment & Plan:   87.  48 year-old female with upper and lower abdominal pain, status post lap band surgery 2008 -CBC, CMP and CRP - Schedule abdominal/pelvic CT with and without oral and IV contrast, patient aware the above laboratory studies to be completed and reviewed prior to proceeding with the CT -Trial with omeprazole 20 mg 1 capsule p.o. daily  2.  Family history of Lynch syndrome and colon cancer -Schedule EGD and colonoscopy, benefits and risks discussed with patient  3.  Constipation -Benefiber 1 tablespoon once daily -MiraLAX 1 capful mixed in 8 ounces of water at bedtime  Further follow up to be determined after the above evaluation completed

## 2019-06-23 NOTE — Patient Instructions (Signed)
1. Complete the lab tests before proceeding with an abdominal/pelvic CT scan  2. Schedule abdominal/pelvic CT scan  3. Schedule EGD and colonoscopy   4. Omeprazole 20mg  once daily to be taken 30 minutes before breakfast   5. Miralax 1 capful mixed in 8 ounces of water at bed time, Benefiber 1 tablespoon once daily if tolerated  6. Call our office if your abdominal pains worsen

## 2019-06-24 DIAGNOSIS — M546 Pain in thoracic spine: Secondary | ICD-10-CM | POA: Diagnosis not present

## 2019-06-24 DIAGNOSIS — M542 Cervicalgia: Secondary | ICD-10-CM | POA: Diagnosis not present

## 2019-06-24 DIAGNOSIS — M545 Low back pain: Secondary | ICD-10-CM | POA: Diagnosis not present

## 2019-06-24 LAB — COMPLETE METABOLIC PANEL WITH GFR
AG Ratio: 1.6 (calc) (ref 1.0–2.5)
ALT: 11 U/L (ref 6–29)
AST: 13 U/L (ref 10–35)
Albumin: 4.2 g/dL (ref 3.6–5.1)
Alkaline phosphatase (APISO): 60 U/L (ref 31–125)
BUN: 10 mg/dL (ref 7–25)
CO2: 30 mmol/L (ref 20–32)
Calcium: 9.7 mg/dL (ref 8.6–10.2)
Chloride: 104 mmol/L (ref 98–110)
Creat: 0.59 mg/dL (ref 0.50–1.10)
GFR, Est African American: 127 mL/min/{1.73_m2} (ref >=60)
GFR, Est Non African American: 109 mL/min/{1.73_m2} (ref >=60)
Globulin: 2.6 g/dL (calc) (ref 1.9–3.7)
Glucose, Bld: 81 mg/dL (ref 65–139)
Potassium: 4.2 mmol/L (ref 3.5–5.3)
Sodium: 141 mmol/L (ref 135–146)
Total Bilirubin: 0.4 mg/dL (ref 0.2–1.2)
Total Protein: 6.8 g/dL (ref 6.1–8.1)

## 2019-06-24 LAB — CBC WITH DIFFERENTIAL/PLATELET
Absolute Monocytes: 392 cells/uL (ref 200–950)
Basophils Absolute: 20 cells/uL (ref 0–200)
Basophils Relative: 0.4 %
Eosinophils Absolute: 69 cells/uL (ref 15–500)
Eosinophils Relative: 1.4 %
HCT: 37 % (ref 35.0–45.0)
Hemoglobin: 12.4 g/dL (ref 11.7–15.5)
Lymphs Abs: 1588 cells/uL (ref 850–3900)
MCH: 32 pg (ref 27.0–33.0)
MCHC: 33.5 g/dL (ref 32.0–36.0)
MCV: 95.6 fL (ref 80.0–100.0)
MPV: 10 fL (ref 7.5–12.5)
Monocytes Relative: 8 %
Neutro Abs: 2832 cells/uL (ref 1500–7800)
Neutrophils Relative %: 57.8 %
Platelets: 266 10*3/uL (ref 140–400)
RBC: 3.87 10*6/uL (ref 3.80–5.10)
RDW: 11.8 % (ref 11.0–15.0)
Total Lymphocyte: 32.4 %
WBC: 4.9 10*3/uL (ref 3.8–10.8)

## 2019-06-24 LAB — C-REACTIVE PROTEIN: CRP: 3.6 mg/L (ref <8.0)

## 2019-06-27 ENCOUNTER — Other Ambulatory Visit (INDEPENDENT_AMBULATORY_CARE_PROVIDER_SITE_OTHER): Payer: Self-pay | Admitting: Nurse Practitioner

## 2019-06-27 DIAGNOSIS — R1030 Lower abdominal pain, unspecified: Secondary | ICD-10-CM

## 2019-06-27 DIAGNOSIS — R1084 Generalized abdominal pain: Secondary | ICD-10-CM

## 2019-06-27 DIAGNOSIS — Z1509 Genetic susceptibility to other malignant neoplasm: Secondary | ICD-10-CM

## 2019-06-27 DIAGNOSIS — Z800 Family history of malignant neoplasm of digestive organs: Secondary | ICD-10-CM

## 2019-06-27 NOTE — Addendum Note (Signed)
Addended by: Noralyn Pick on: 06/27/2019 12:57 PM   Modules accepted: Orders

## 2019-07-08 ENCOUNTER — Other Ambulatory Visit: Payer: Self-pay

## 2019-07-08 ENCOUNTER — Encounter (HOSPITAL_COMMUNITY): Payer: Self-pay

## 2019-07-08 ENCOUNTER — Ambulatory Visit (HOSPITAL_COMMUNITY)
Admission: RE | Admit: 2019-07-08 | Discharge: 2019-07-08 | Disposition: A | Discharge status: Home / Self Care | Payer: BC Managed Care – PPO | Source: Ambulatory Visit | Attending: Nurse Practitioner | Admitting: Nurse Practitioner

## 2019-07-08 DIAGNOSIS — R1030 Lower abdominal pain, unspecified: Secondary | ICD-10-CM | POA: Insufficient documentation

## 2019-07-08 DIAGNOSIS — R1084 Generalized abdominal pain: Secondary | ICD-10-CM | POA: Diagnosis not present

## 2019-07-08 DIAGNOSIS — R109 Unspecified abdominal pain: Secondary | ICD-10-CM | POA: Diagnosis not present

## 2019-07-08 DIAGNOSIS — Z1509 Genetic susceptibility to other malignant neoplasm: Secondary | ICD-10-CM

## 2019-07-08 DIAGNOSIS — Z800 Family history of malignant neoplasm of digestive organs: Secondary | ICD-10-CM | POA: Insufficient documentation

## 2019-07-08 MED ORDER — IOHEXOL 300 MG/ML  SOLN
100.0000 mL | Freq: Once | INTRAMUSCULAR | Status: AC | PRN
  Administered 2019-07-08: 100 mL via INTRAVENOUS

## 2019-07-09 ENCOUNTER — Encounter (INDEPENDENT_AMBULATORY_CARE_PROVIDER_SITE_OTHER): Payer: Self-pay | Admitting: Nurse Practitioner

## 2019-07-10 NOTE — Progress Notes (Signed)
Ann, pls mail my letter to patient and include a copy of her abd/pelvic CT report in letter thx

## 2019-07-11 ENCOUNTER — Encounter (INDEPENDENT_AMBULATORY_CARE_PROVIDER_SITE_OTHER): Payer: Self-pay | Admitting: *Deleted

## 2019-07-11 ENCOUNTER — Encounter (INDEPENDENT_AMBULATORY_CARE_PROVIDER_SITE_OTHER): Payer: Self-pay | Admitting: Nurse Practitioner

## 2019-07-12 ENCOUNTER — Ambulatory Visit: Payer: Self-pay | Admitting: Neurology

## 2019-07-13 ENCOUNTER — Encounter: Payer: Self-pay | Admitting: Neurology

## 2019-07-28 DIAGNOSIS — Z0000 Encounter for general adult medical examination without abnormal findings: Secondary | ICD-10-CM | POA: Diagnosis not present

## 2019-07-28 DIAGNOSIS — Z6832 Body mass index (BMI) 32.0-32.9, adult: Secondary | ICD-10-CM | POA: Diagnosis not present

## 2019-07-28 DIAGNOSIS — M545 Low back pain: Secondary | ICD-10-CM | POA: Diagnosis not present

## 2019-07-28 DIAGNOSIS — K642 Third degree hemorrhoids: Secondary | ICD-10-CM | POA: Diagnosis not present

## 2019-07-28 DIAGNOSIS — N62 Hypertrophy of breast: Secondary | ICD-10-CM | POA: Diagnosis not present

## 2019-08-09 ENCOUNTER — Other Ambulatory Visit (HOSPITAL_COMMUNITY)
Admission: RE | Admit: 2019-08-09 | Discharge: 2019-08-09 | Disposition: A | Discharge status: Home / Self Care | Payer: BC Managed Care – PPO | Source: Ambulatory Visit | Attending: Internal Medicine | Admitting: Internal Medicine

## 2019-08-09 DIAGNOSIS — Z01812 Encounter for preprocedural laboratory examination: Secondary | ICD-10-CM | POA: Insufficient documentation

## 2019-08-09 DIAGNOSIS — Z20828 Contact with and (suspected) exposure to other viral communicable diseases: Secondary | ICD-10-CM | POA: Insufficient documentation

## 2019-08-09 LAB — SARS CORONAVIRUS 2 (TAT 6-24 HRS): SARS Coronavirus 2: NEGATIVE

## 2019-08-11 ENCOUNTER — Ambulatory Visit (HOSPITAL_COMMUNITY)
Admission: RE | Admit: 2019-08-11 | Discharge: 2019-08-11 | Disposition: A | Discharge status: Home / Self Care | Payer: BC Managed Care – PPO | Attending: Internal Medicine | Admitting: Internal Medicine

## 2019-08-11 ENCOUNTER — Encounter (HOSPITAL_COMMUNITY)
Admission: RE | Disposition: A | Discharge status: Home / Self Care | Payer: Self-pay | Source: Home / Self Care | Attending: Internal Medicine

## 2019-08-11 ENCOUNTER — Encounter (HOSPITAL_COMMUNITY): Payer: Self-pay | Admitting: *Deleted

## 2019-08-11 ENCOUNTER — Other Ambulatory Visit: Payer: Self-pay

## 2019-08-11 DIAGNOSIS — Z79899 Other long term (current) drug therapy: Secondary | ICD-10-CM | POA: Insufficient documentation

## 2019-08-11 DIAGNOSIS — R1010 Upper abdominal pain, unspecified: Secondary | ICD-10-CM | POA: Diagnosis not present

## 2019-08-11 DIAGNOSIS — Z9049 Acquired absence of other specified parts of digestive tract: Secondary | ICD-10-CM | POA: Insufficient documentation

## 2019-08-11 DIAGNOSIS — R112 Nausea with vomiting, unspecified: Secondary | ICD-10-CM | POA: Insufficient documentation

## 2019-08-11 DIAGNOSIS — Z1509 Genetic susceptibility to other malignant neoplasm: Secondary | ICD-10-CM | POA: Insufficient documentation

## 2019-08-11 DIAGNOSIS — Z9884 Bariatric surgery status: Secondary | ICD-10-CM | POA: Insufficient documentation

## 2019-08-11 DIAGNOSIS — F419 Anxiety disorder, unspecified: Secondary | ICD-10-CM | POA: Diagnosis not present

## 2019-08-11 DIAGNOSIS — D131 Benign neoplasm of stomach: Secondary | ICD-10-CM | POA: Diagnosis not present

## 2019-08-11 DIAGNOSIS — Z1211 Encounter for screening for malignant neoplasm of colon: Secondary | ICD-10-CM | POA: Diagnosis not present

## 2019-08-11 DIAGNOSIS — R1084 Generalized abdominal pain: Secondary | ICD-10-CM

## 2019-08-11 DIAGNOSIS — K2100 Gastro-esophageal reflux disease with esophagitis, without bleeding: Secondary | ICD-10-CM

## 2019-08-11 DIAGNOSIS — K648 Other hemorrhoids: Secondary | ICD-10-CM

## 2019-08-11 DIAGNOSIS — F329 Major depressive disorder, single episode, unspecified: Secondary | ICD-10-CM | POA: Diagnosis not present

## 2019-08-11 DIAGNOSIS — Z800 Family history of malignant neoplasm of digestive organs: Secondary | ICD-10-CM | POA: Diagnosis not present

## 2019-08-11 DIAGNOSIS — K5909 Other constipation: Secondary | ICD-10-CM | POA: Insufficient documentation

## 2019-08-11 DIAGNOSIS — R109 Unspecified abdominal pain: Secondary | ICD-10-CM

## 2019-08-11 DIAGNOSIS — K3189 Other diseases of stomach and duodenum: Secondary | ICD-10-CM

## 2019-08-11 DIAGNOSIS — K228 Other specified diseases of esophagus: Secondary | ICD-10-CM

## 2019-08-11 DIAGNOSIS — R1030 Lower abdominal pain, unspecified: Secondary | ICD-10-CM | POA: Diagnosis not present

## 2019-08-11 HISTORY — PX: COLONOSCOPY: SHX5424

## 2019-08-11 HISTORY — PX: POLYPECTOMY: SHX5525

## 2019-08-11 HISTORY — PX: ESOPHAGOGASTRODUODENOSCOPY: SHX5428

## 2019-08-11 HISTORY — DX: Gastro-esophageal reflux disease without esophagitis: K21.9

## 2019-08-11 SURGERY — EGD (ESOPHAGOGASTRODUODENOSCOPY)
Anesthesia: Moderate Sedation

## 2019-08-11 MED ORDER — LIDOCAINE VISCOUS HCL 2 % MT SOLN
OROMUCOSAL | Status: DC | PRN
  Administered 2019-08-11: 1 via OROMUCOSAL

## 2019-08-11 MED ORDER — MEPERIDINE HCL 50 MG/ML IJ SOLN
INTRAMUSCULAR | Status: DC | PRN
  Administered 2019-08-11 (×4): 25 mg via INTRAVENOUS

## 2019-08-11 MED ORDER — LIDOCAINE VISCOUS HCL 2 % MT SOLN
OROMUCOSAL | Status: AC
  Filled 2019-08-11: qty 15

## 2019-08-11 MED ORDER — SODIUM CHLORIDE 0.9% FLUSH
INTRAVENOUS | Status: AC
  Filled 2019-08-11: qty 10

## 2019-08-11 MED ORDER — PROMETHAZINE HCL 25 MG/ML IJ SOLN
INTRAMUSCULAR | Status: AC
  Filled 2019-08-11: qty 1

## 2019-08-11 MED ORDER — FAMOTIDINE 20 MG PO TABS: 20.0000 mg | ORAL_TABLET | Freq: Every day | ORAL | Status: DC

## 2019-08-11 MED ORDER — MEPERIDINE HCL 50 MG/ML IJ SOLN
INTRAMUSCULAR | Status: AC
  Filled 2019-08-11: qty 1

## 2019-08-11 MED ORDER — MIDAZOLAM HCL 5 MG/5ML IJ SOLN
INTRAMUSCULAR | Status: DC | PRN
  Administered 2019-08-11: 2 mg via INTRAVENOUS
  Administered 2019-08-11: 3 mg via INTRAVENOUS
  Administered 2019-08-11 (×2): 2 mg via INTRAVENOUS
  Administered 2019-08-11: 1 mg via INTRAVENOUS
  Administered 2019-08-11: 2 mg via INTRAVENOUS

## 2019-08-11 MED ORDER — MIDAZOLAM HCL 5 MG/5ML IJ SOLN
INTRAMUSCULAR | Status: AC
  Filled 2019-08-11: qty 5

## 2019-08-11 MED ORDER — MIDAZOLAM HCL 5 MG/5ML IJ SOLN
INTRAMUSCULAR | Status: AC
  Filled 2019-08-11: qty 10

## 2019-08-11 MED ORDER — SODIUM CHLORIDE 0.9 % IV SOLN
INTRAVENOUS | Status: DC
  Administered 2019-08-11: 07:00:00 via INTRAVENOUS

## 2019-08-11 MED ORDER — ESOMEPRAZOLE MAGNESIUM 40 MG PO CPDR: 40.0000 mg | DELAYED_RELEASE_CAPSULE | Freq: Every day | ORAL | 5 refills | Status: DC

## 2019-08-11 MED ORDER — PROMETHAZINE HCL 25 MG/ML IJ SOLN
INTRAMUSCULAR | Status: DC | PRN
  Administered 2019-08-11: 6.25 mg via INTRAVENOUS

## 2019-08-11 NOTE — H&P (Signed)
Heather Gibbs is an 48 y.o. female.   Chief Complaint: Patient is here for esophagogastroduodenoscopy followed by colonoscopy. HPI: Patient is 48 year old Caucasian female who has chronic GERD status post lap band surgery 12 years ago.  It has been decompressed.  She presents with abdominal pain nausea and vomiting which she has couple of times a week.  She generally vomits food that she had just eaten.  She feels omeprazole is reasonably controlling her heartburn.  She may have couple of episodes in a month.  No history of hematemesis or melena.  She has chronic constipation.  Her bowels move daily with help of stool softener and MiraLAX.  No history of rectal bleeding. His last colonoscopy was in April 2019. Family history positive for colon carcinoma and other malignancies. Father had colon cancer in his 26s. Paternal grandfather had colon cancer in his 45s.  One sister had colon cancer in her late 86s or early 74s.  Mother with history of lung carcinoma.  She had a left lung removed. Her sister has been treated for breast carcinoma. One sister had pancreatic carcinoma in her 22s.  Patient does not know if she is alive or disease.   Past Medical History:  Diagnosis Date  . Anxiety   . Complication of anesthesia   . Depression   . Family hx of colon cancer 11/19/2017  . GERD (gastroesophageal reflux disease)   . Headache   . Lynch syndrome 11/19/2017  . PONV (postoperative nausea and vomiting)   . Restless leg   . URI (upper respiratory infection)     Past Surgical History:  Procedure Laterality Date  . ABDOMINAL HYSTERECTOMY     complete  . BILATERAL OOPHORECTOMY    . BREAST LUMPECTOMY Right   . CHOLECYSTECTOMY    . COLONOSCOPY N/A 02/18/2018   Procedure: COLONOSCOPY;  Surgeon: Rogene Houston, MD;  Location: [facility];  Service: Endoscopy;  Laterality: N/A;  8:30  . DILATION AND CURETTAGE OF UTERUS    . LAPAROSCOPIC GASTRIC BANDING     2008  . PARTIAL HYSTERECTOMY    .  TONSILLECTOMY      Family History  Problem Relation Age of Onset  . Hypertension Mother   . Diabetes Mother   . Lung cancer Mother   . Cancer - Lung Mother   . Brain cancer Sister   . Breast cancer Sister   . Breast cancer Maternal Grandmother   . Lung cancer Paternal Grandmother   . COPD Maternal Aunt   . Breast cancer Maternal Aunt   . Seizures Daughter        grand mal, as a young child  . Seizures Daughter        petite mal, as a young child   . Chiari malformation Daughter    Social History:  reports that she has never smoked. She has never used smokeless tobacco. She reports previous alcohol use. She reports that she does not use drugs.  Allergies:  Allergies  Allergen Reactions  . Adhesive [Tape] Other (See Comments)    Blistering    Medications Prior to Admission  Medication Sig Dispense Refill  . cyclobenzaprine (FLEXERIL) 5 MG tablet Take 5 mg by mouth 2 (two) times daily as needed for muscle spasms.     Marland Kitchen docusate sodium (COLACE) 100 MG capsule Take 200 mg by mouth daily.    Marland Kitchen escitalopram (LEXAPRO) 20 MG tablet Take 20 mg by mouth at bedtime.   0  . ferrous sulfate  325 (65 FE) MG tablet Take 325 mg by mouth daily.     . fluticasone (FLONASE) 50 MCG/ACT nasal spray Place 2 sprays into both nostrils daily.     Marland Kitchen gabapentin (NEURONTIN) 300 MG capsule Take 300 mg by mouth See admin instructions. Take 300 mg at bedtime, may take a second 300 mg dose during the day as needed for pain  0  . omeprazole (PRILOSEC) 20 MG capsule Take 1 capsule (20 mg total) by mouth daily. 90 capsule 3  . polyethylene glycol (MIRALAX / GLYCOLAX) 17 g packet Take 17 g by mouth at bedtime.    . traZODone (DESYREL) 100 MG tablet Take 100 mg by mouth at bedtime.    . cetirizine (ZYRTEC) 10 MG tablet Take 10 mg by mouth daily as needed for allergies.    . traZODone (DESYREL) 50 MG tablet Take 2 tablets (100 mg total) by mouth at bedtime. (Patient not taking: Reported on 08/08/2019) 60 tablet 3     No results found for this or any previous visit (from the past 48 hour(s)). No results found.  ROS  Blood pressure 138/78, pulse 87, temperature 98.5 F (36.9 C), temperature source Oral, resp. rate 14, height 5\' 3"  (1.6 m), weight 85 kg, SpO2 98 %. Physical Exam  Constitutional: She appears well-developed and well-nourished.  HENT:  Mouth/Throat: Oropharynx is clear and moist.  Eyes: Conjunctivae are normal. No scleral icterus.  Neck: No thyromegaly present.  Cardiovascular: Normal rate, regular rhythm and normal heart sounds.  No murmur heard. Respiratory: Effort normal and breath sounds normal.  GI:  Abdomen is full.  She has small upper midline and laparoscopy scars as well as Pfannenstiel scars. She has mild generalized tenderness which is on superficial palpation.  No organomegaly or masses.  Musculoskeletal:        General: No edema.  Lymphadenopathy:    She has no cervical adenopathy.  Neurological: She is alert.  Skin: Skin is warm and dry.     Assessment/Plan  Nausea vomiting and abdominal pain. Patient carries gene for Lynch syndrome. Diagnostic esophagogastroduodenoscopy and high risk screening colonoscopy.  Hildred Laser, MD 08/11/2019, 7:34 AM

## 2019-08-11 NOTE — Op Note (Signed)
[facility] Patient Name: Heather Gibbs Procedure Date: 08/11/2019 7:58 AM MRN: [id number] Date of Birth: 11/19/1970 Attending MD: Hildred Laser , MD CSN: [id number] Age: 48 Admit Type: Outpatient Procedure:                Colonoscopy Indications:              Screening for colorectal malignant neoplasm, Lynch                            Syndrome Providers:                Hildred Laser, MD, Charlsie Quest. Theda Sers RN, RN, Nelma Rothman, Technician Referring MD:             Stoney Bang, MD Medicines:                Meperidine 25 mg IV, Midazolam 3 mg IV Complications:            No immediate complications. Estimated Blood Loss:     Estimated blood loss: none. Procedure:                Pre-Anesthesia Assessment:                           - Prior to the procedure, a History and Physical                            was performed, and patient medications and                            allergies were reviewed. The patient's tolerance of                            previous anesthesia was also reviewed. The risks                            and benefits of the procedure and the sedation                            options and risks were discussed with the patient.                            All questions were answered, and informed consent                            was obtained. Prior Anticoagulants: The patient has                            taken no previous anticoagulant or antiplatelet                            agents. ASA Grade Assessment: II - A patient with  mild systemic disease. After reviewing the risks                            and benefits, the patient was deemed in                            satisfactory condition to undergo the procedure.                           - Prior to the procedure, a History and Physical                            was performed, and patient medications and                            allergies were  reviewed. The patient's tolerance of                            previous anesthesia was also reviewed. The risks                            and benefits of the procedure and the sedation                            options and risks were discussed with the patient.                            All questions were answered, and informed consent                            was obtained. Prior Anticoagulants: The patient has                            taken no previous anticoagulant or antiplatelet                            agents. ASA Grade Assessment: II - A patient with                            mild systemic disease. After reviewing the risks                            and benefits, the patient was deemed in                            satisfactory condition to undergo the procedure.                           After obtaining informed consent, the colonoscope                            was passed under direct vision. Throughout the  procedure, the patient's blood pressure, pulse, and                            oxygen saturations were monitored continuously. The                            [id number]) scope was introduced through                            the anus and advanced to the the cecum, identified                            by appendiceal orifice and ileocecal valve. The                            colonoscopy was performed without difficulty. The                            patient tolerated the procedure well. The quality                            of the bowel preparation was excellent. Scope In: 8:03:58 AM Scope Out: 8:22:45 AM Scope Withdrawal Time: 0 hours 5 minutes 34 seconds  Total Procedure Duration: 0 hours 18 minutes 47 seconds  Findings:      The perianal and digital rectal examinations were normal.      The colon (entire examined portion) appeared normal.      Internal hemorrhoids were found during retroflexion. The hemorrhoids       were  small. Impression:               - The entire examined colon is normal.                           - Internal hemorrhoids.                           - No specimens collected. Moderate Sedation:      Moderate (conscious) sedation was administered by the endoscopy nurse       and supervised by the endoscopist. The following parameters were       monitored: oxygen saturation, heart rate, blood pressure, CO2       capnography and response to care. Total physician intraservice time was       24 minutes. Recommendation:           - Patient has a contact number available for                            emergencies. The signs and symptoms of potential                            delayed complications were discussed with the                            patient. Return to normal activities tomorrow.  Written discharge instructions were provided to the                            patient.                           - Resume previous diet today.                           - Continue present medications.                           - Repeat colonoscopy in 2 years for screening                            purposes. Procedure Code(s):        --- Professional ---                           [phone], Colonoscopy, flexible; diagnostic, including                            collection of specimen(s) by brushing or washing,                            when performed (separate procedure)                           99153, Moderate sedation; each additional 15                            minutes intraservice time                           G0500, Moderate sedation services provided by the                            same physician or other qualified health care                            professional performing a gastrointestinal                            endoscopic service that sedation supports,                            requiring the presence of an independent trained                             observer to assist in the monitoring of the                            patient's level of consciousness and physiological                            status; initial 15 minutes of intra-service time;  patient age 62 years or older (additional time may                            be reported with [phone], as appropriate) Diagnosis Code(s):        --- Professional ---                           Z12.11, Encounter for screening for malignant                            neoplasm of colon                           K64.8, Other hemorrhoids                           Z15.09, Genetic susceptibility to other malignant                            neoplasm CPT copyright 2019 [employer]. All rights reserved. The codes documented in this report are preliminary and upon coder review may  be revised to meet current compliance requirements. Hildred Laser, MD Hildred Laser, MD 08/11/2019 8:43:36 AM This report has been signed electronically. Number of Addenda: 0

## 2019-08-11 NOTE — Op Note (Signed)
[facility] Patient Name: Heather Gibbs Procedure Date: 08/11/2019 7:33 AM MRN: [id number] Date of Birth: August 10, 1971 Attending MD: Hildred Laser , MD CSN: [id number] Age: 48 Admit Type: Outpatient Procedure:                Upper GI endoscopy Indications:              Abdominal pain, Nausea with vomiting Providers:                Hildred Laser, MD, Charlsie Quest. Theda Sers RN, RN, Nelma Rothman, Technician Referring MD:             Stoney Bang, MD Medicines:                Lidocaine spray, Meperidine 75 mg IV, Midazolam 9                            mg IV, Promethazine 123XX123 mg IV Complications:            No immediate complications. Estimated Blood Loss:     Estimated blood loss: none. Procedure:                Pre-Anesthesia Assessment:                           - Prior to the procedure, a History and Physical                            was performed, and patient medications and                            allergies were reviewed. The patient's tolerance of                            previous anesthesia was also reviewed. The risks                            and benefits of the procedure and the sedation                            options and risks were discussed with the patient.                            All questions were answered, and informed consent                            was obtained. Prior Anticoagulants: The patient has                            taken no previous anticoagulant or antiplatelet                            agents. ASA Grade Assessment: II - A patient with  mild systemic disease. After reviewing the risks                            and benefits, the patient was deemed in                            satisfactory condition to undergo the procedure.                           After obtaining informed consent, the endoscope was                            passed under direct vision. Throughout the        procedure, the patient's blood pressure, pulse, and                            oxygen saturations were monitored continuously. The                            [id number]) scope was introduced through the                            mouth, and advanced to the second part of duodenum.                            The upper GI endoscopy was accomplished without                            difficulty. The patient tolerated the procedure                            well. Scope In: 7:49:56 AM Scope Out: 7:57:38 AM Total Procedure Duration: 0 hours 7 minutes 42 seconds  Findings:      The proximal esophagus and mid esophagus were normal.      LA Grade B (one or more mucosal breaks greater than 5 mm, not extending       between the tops of two mucosal folds) esophagitis with no bleeding was       found 32 to 34 cm from the incisors.      The Z-line was regular and was found 34 cm from the incisors.      A single 5 mm polyp was found 34 cm from the incisors. The polyp was       removed with a hot snare. Resection and retrieval were complete.      Extrinsic compression on the stomach was found in the gastric body. lap       band      The exam of the stomach was otherwise normal.      The duodenal bulb and second portion of the duodenum were normal. Impression:               - Normal proximal esophagus and mid esophagus.                           - LA Grade B reflux esophagitis.                           -  Z-line regular, 34 cm from the incisors.                           - Esophageal polyp(s) were found. Resected and                            retrieved.                           - Extrinsic compression in the gastric body                            secondary to lap band.                           - Normal duodenal bulb and second portion of the                            duodenum. Moderate Sedation:      Moderate (conscious) sedation was administered by the endoscopy nurse       and  supervised by the endoscopist. The following parameters were       monitored: oxygen saturation, heart rate, blood pressure, CO2       capnography and response to care. Total physician intraservice time was       15 minutes. Recommendation:           - Patient has a contact number available for                            emergencies. The signs and symptoms of potential                            delayed complications were discussed with the                            patient. Return to normal activities tomorrow.                            Written discharge instructions were provided to the                            patient.                           - See the other procedure note for documentation of                            additional recommendations.                           - DC Omeprazole.                           - Esomeprazole 40 mg po qam.                           - Famotidine 20  mg po qhs.                           - No aspirin, ibuprofen, naproxen, or other                            non-steroidal anti-inflammatory drugs for 5 days                            after polyp removal.                           - Await pathology results. Procedure Code(s):        --- Professional ---                           [phone], Esophagogastroduodenoscopy, flexible,                            transoral; with removal of tumor(s), polyp(s), or                            other lesion(s) by snare technique                           G0500, Moderate sedation services provided by the                            same physician or other qualified health care                            professional performing a gastrointestinal                            endoscopic service that sedation supports,                            requiring the presence of an independent trained                            observer to assist in the monitoring of the                            patient's level of consciousness  and physiological                            status; initial 15 minutes of intra-service time;                            patient age 5 years or older (additional time may                            be reported with [phone], as appropriate) Diagnosis Code(s):        --- Professional ---  K21.0, Gastro-esophageal reflux disease with                            esophagitis                           K22.8, Other specified diseases of esophagus                           K31.89, Other diseases of stomach and duodenum                           R10.9, Unspecified abdominal pain                           R11.2, Nausea with vomiting, unspecified CPT copyright 2019 American Medical Association. All rights reserved. The codes documented in this report are preliminary and upon coder review may  be revised to meet current compliance requirements. Hildred Laser, MD Hildred Laser, MD 08/11/2019 8:39:09 AM This report has been signed electronically. Number of Addenda: 0

## 2019-08-11 NOTE — Discharge Instructions (Signed)
No aspirin or NSAIDs for 5 days. Discontinue omeprazole. Begin esomeprazole 40 mg by mouth 30 minutes before breakfast daily. Famotidine/Pepcid OTC 20 mg by mouth daily at bedtime. Resume other medications as before. 6 small meals instead of 3 meals daily No driving for 24 hours. Physician will call with biopsy results. We will schedule upper GI series within the next 2 weeks.   Colonoscopy, Adult, Care After This sheet gives you information about how to care for yourself after your procedure. Your health care provider may also give you more specific instructions. If you have problems or questions, contact your health care provider.  Dr. Laural [id number] What can I expect after the procedure? After the procedure, it is common to have:  A small amount of blood in your stool for 24 hours after the procedure.  Some gas.  Mild abdominal cramping or bloating. Follow these instructions at home: General instructions  For the first 24 hours after the procedure: ? Do not drive or use machinery. ? Do not sign important documents. ? Do not drink alcohol. ? Do your regular daily activities at a slower pace than normal. ? Eat soft, easy-to-digest foods.  Take over-the-counter or prescription medicines only as told by your health care provider. Relieving cramping and bloating   Try walking around when you have cramps or feel bloated.  Eating and drinking   Drink enough fluid to keep your urine pale yellow.  Resume your normal diet as instructed by your health care provider.  Avoid drinking alcohol for 24 hours as instructed by your health care provider. Contact a health care provider if:  You have blood in your stool 2-3 days after the procedure. Get help right away if:  You have more than a small spotting of blood in your stool.  You pass large blood clots in your stool.  Your abdomen is swollen.  You have nausea or vomiting.  You have a fever.  You have increasing  abdominal pain that is not relieved with medicine. Summary  After the procedure, it is common to have a small amount of blood in your stool. You may also have mild abdominal cramping and bloating.  For the first 24 hours after the procedure, do not drive or use machinery, sign important documents, or drink alcohol.  Contact your health care provider if you have a lot of blood in your stool, nausea or vomiting, a fever, or increased abdominal pain. This information is not intended to replace advice given to you by your health care provider. Make sure you discuss any questions you have with your health care provider. Document Released: 06/10/2004 Document Revised: 08/19/2017 Document Reviewed: 01/08/2016 Elsevier Patient Education  2020 [place].   Upper Endoscopy, Adult, Care After This sheet gives you information about how to care for yourself after your procedure. Your health care provider may also give you more specific instructions. If you have problems or questions, contact your health care provider. What can I expect after the procedure? After the procedure, it is common to have:  A sore throat.  Mild stomach pain or discomfort.  Bloating.  Nausea. Follow these instructions at home:   Return to your normal activities but at a slower pace.   Take over-the-counter and prescription medicines only as told by your health care provider.  Do not drive for 24 hours if you were given a sedative during your procedure.  Keep all follow-up visits as told by your health care provider. This is important. Contact  a health care provider if you have:  A sore throat that lasts longer than one day.  Trouble swallowing. Get help right away if:  You vomit blood or your vomit looks like coffee grounds.  You have: ? A fever. ? Bloody, black, or tarry stools. ? A severe sore throat or you cannot swallow. ? Difficulty breathing. ? Severe pain in your chest or  abdomen. Summary  After the procedure, it is common to have a sore throat, mild stomach discomfort, bloating, and nausea.  Do not drive for 24 hours if you were given a sedative during the procedure.  Follow instructions from your health care provider about what to eat or drink after your procedure.  Return to your normal activities as told by your health care provider. This information is not intended to replace advice given to you by your health care provider. Make sure you discuss any questions you have with your health care provider. Document Released: 04/27/2012 Document Revised: 04/20/2018 Document Reviewed: 03/29/2018 Elsevier Patient Education  [place].   Barium Swallow A barium swallow is an X-ray test that is used to check:  Your throat.  The part of your body that moves food from your mouth to your stomach (esophagus).  Your stomach. For this test, you will drink a white liquid called barium. The liquid shows up well on X-rays and helps your doctor see problems. Tell a doctor about:  Any allergies you have. This should include any allergies to substances that are used in X-ray tests (contrast materials).  All medicines you are taking, including vitamins, herbs, eye drops, creams, and over-the-counter medicines.  Any surgeries you have had.  Any medical conditions you have.  Whether you are pregnant or may be pregnant. What are the risks? Generally, this is a safe test. However, problems may occur. These may include:  Trouble pooping.  A small amount of high-energy rays (radiation) going in your body.  Allergic reaction to the liquid that you drink for the test. What happens before the procedure?  Follow your doctor's instructions about limiting what you eat or drink.  Ask your doctor about changing or stopping your normal medicines. This is important if you take diabetes medicines or blood thinners. What happens during the procedure?  You will  be positioned on an X-ray table.  You will drink the liquid barium. This liquid looks like a milkshake.  The X-ray table may be moved so you are sitting up more. You may also need to change your position on the table.  X-ray pictures will be shown on a screen (monitor). This lets the doctor watch the barium as it passes through your body. The procedure may vary among doctors and hospitals. What happens after the procedure?  Your poop (feces) may be white or gray for 2-3 days. You may be given a medicine that helps you poop (laxative). This helps the barium leave your body.  Ask your doctor when and how you will get your test results.  Talk with your doctor about what your results mean. Follow these instructions at home:   Go back to your normal activities and your normal diet as told by your doctor.  To help you not have trouble pooping after this test, your doctor may tell you to: ? Drink enough fluid to keep your pee (urine) pale yellow. ? Eat foods that have a lot of fiber in them. These include fruits, vegetables, whole grains, and beans. ? Limit foods that are high  in fat and sugars. These include fried or sweet foods. ? Take an over-the-counter or prescription medicine. Contact a doctor if you:  Have trouble pooping.  Cannot poop or pass gas.  Have pain or swelling in your belly.  Have a fever. Summary  A barium swallow is an X-ray test that is used to check your throat, your stomach, and the part of your body that moves food from your mouth to your stomach.  You will drink a white liquid called barium that shows up well on X-rays.  Talk with your doctor about what your results mean. This information is not intended to replace advice given to you by your health care provider. Make sure you discuss any questions you have with your health care provider. Document Released: 11/29/2010 Document Revised: 10/09/2017 Document Reviewed: 09/03/2017 Elsevier Patient Education   2020 [employer].

## 2019-08-12 ENCOUNTER — Other Ambulatory Visit: Payer: Self-pay

## 2019-08-12 LAB — SURGICAL PATHOLOGY

## 2019-08-15 ENCOUNTER — Other Ambulatory Visit (INDEPENDENT_AMBULATORY_CARE_PROVIDER_SITE_OTHER): Payer: Self-pay | Admitting: *Deleted

## 2019-08-15 DIAGNOSIS — K312 Hourglass stricture and stenosis of stomach: Secondary | ICD-10-CM

## 2019-08-15 DIAGNOSIS — K9509 Other complications of gastric band procedure: Secondary | ICD-10-CM

## 2019-08-17 ENCOUNTER — Encounter (HOSPITAL_COMMUNITY): Payer: Self-pay | Admitting: Internal Medicine

## 2019-08-19 ENCOUNTER — Other Ambulatory Visit (HOSPITAL_COMMUNITY): Payer: BC Managed Care – PPO

## 2019-08-25 ENCOUNTER — Other Ambulatory Visit (INDEPENDENT_AMBULATORY_CARE_PROVIDER_SITE_OTHER): Payer: Self-pay | Admitting: Internal Medicine

## 2019-08-25 ENCOUNTER — Ambulatory Visit (HOSPITAL_COMMUNITY)
Admission: RE | Admit: 2019-08-25 | Discharge: 2019-08-25 | Disposition: A | Discharge status: Home / Self Care | Payer: BC Managed Care – PPO | Source: Ambulatory Visit | Attending: Internal Medicine | Admitting: Internal Medicine

## 2019-08-25 ENCOUNTER — Other Ambulatory Visit: Payer: Self-pay

## 2019-08-25 DIAGNOSIS — K228 Other specified diseases of esophagus: Secondary | ICD-10-CM | POA: Diagnosis not present

## 2019-08-25 DIAGNOSIS — K9509 Other complications of gastric band procedure: Secondary | ICD-10-CM | POA: Diagnosis not present

## 2019-08-25 DIAGNOSIS — K312 Hourglass stricture and stenosis of stomach: Secondary | ICD-10-CM

## 2019-09-08 DIAGNOSIS — Z4651 Encounter for fitting and adjustment of gastric lap band: Secondary | ICD-10-CM | POA: Diagnosis not present

## 2019-10-03 DIAGNOSIS — Z20828 Contact with and (suspected) exposure to other viral communicable diseases: Secondary | ICD-10-CM | POA: Diagnosis not present

## 2019-10-04 DIAGNOSIS — R438 Other disturbances of smell and taste: Secondary | ICD-10-CM | POA: Diagnosis not present

## 2019-10-04 DIAGNOSIS — J029 Acute pharyngitis, unspecified: Secondary | ICD-10-CM | POA: Diagnosis not present

## 2019-10-14 IMAGING — CT CT ABDOMEN AND PELVIS WITH CONTRAST
2 of 5 series · 16 of 46 positions shown, 18 images · IV contrast (omnipaque)
Comparison: Abdominal ultrasound dated 11/09/2017

CLINICAL DATA: 48-year-old female with abdominal pain. History of
lap band.

EXAM:
CT ABDOMEN AND PELVIS WITH CONTRAST
TECHNIQUE: Multidetector CT imaging of the abdomen and pelvis was performed
using the standard protocol following bolus administration of
intravenous contrast.
CONTRAST:  100mL OMNIPAQUE IOHEXOL 300 MG/ML  SOLN

[Series 2: axial st · axial · 0.97mm/px · z∈[-450,-50]mm · 13 of 94 slices shown, 15 images]
[im 7/94  soft-tissue]
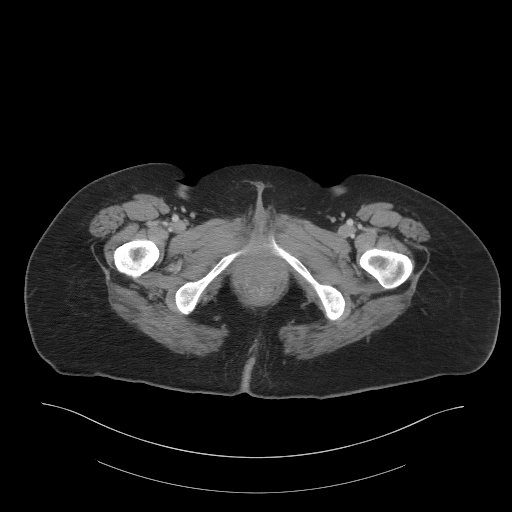
[im 7/94  bone]
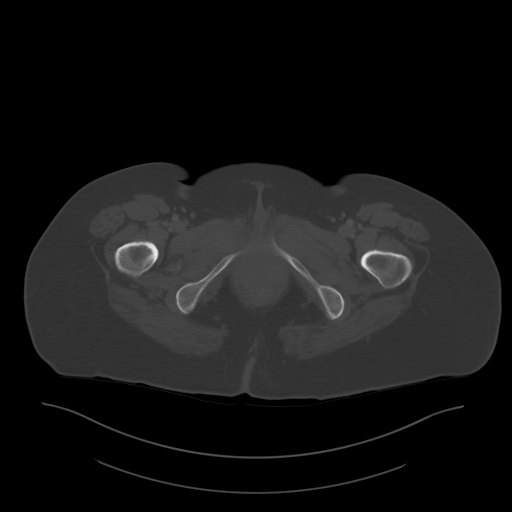
[im 14/94  soft-tissue]
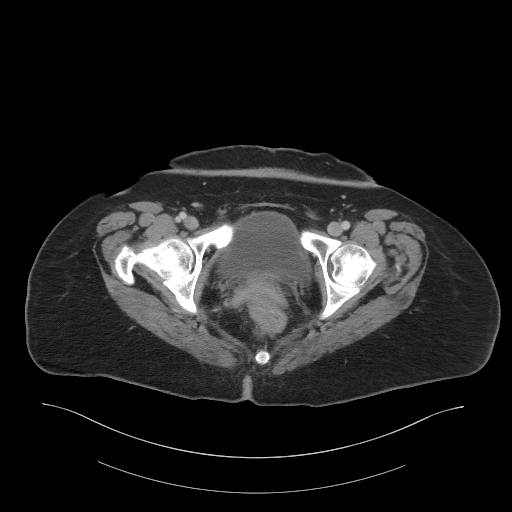
[im 20/94  soft-tissue]
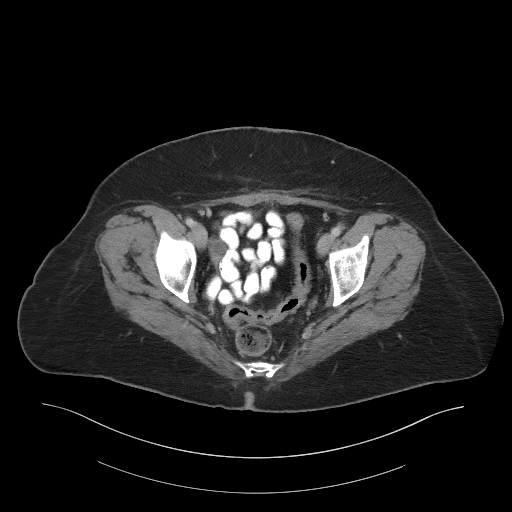
[im 27/94  soft-tissue]
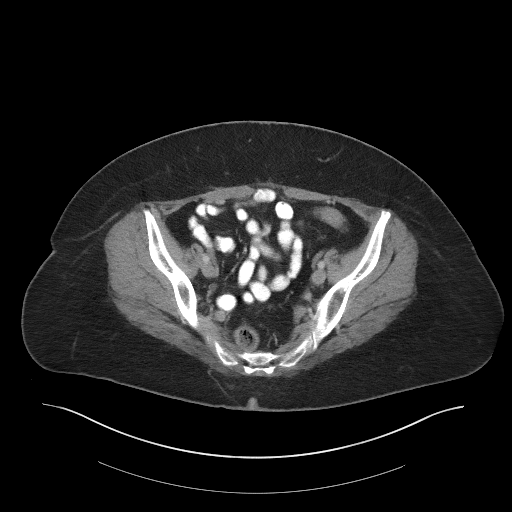
[im 34/94  soft-tissue]
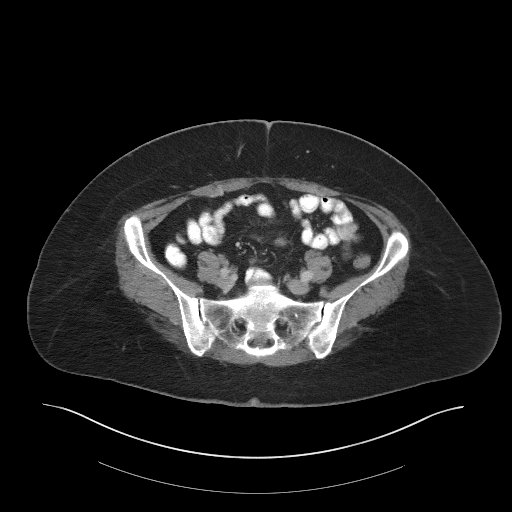
[im 40/94  soft-tissue]
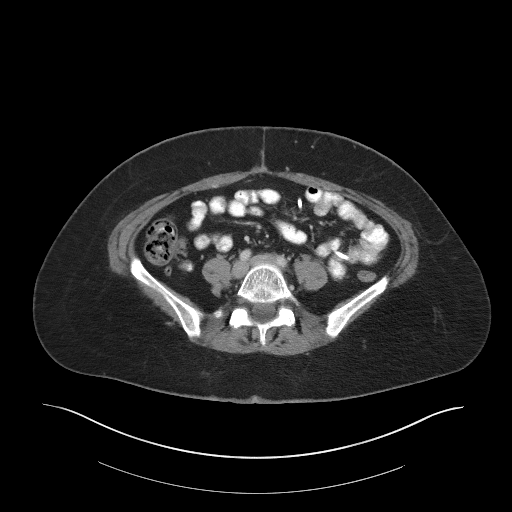
[im 47/94  soft-tissue]
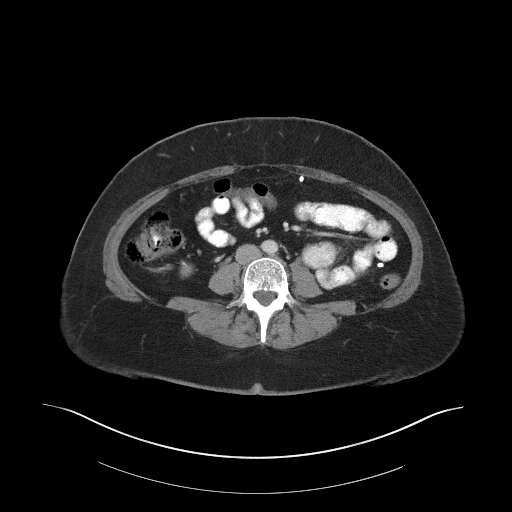
[im 54/94  soft-tissue]
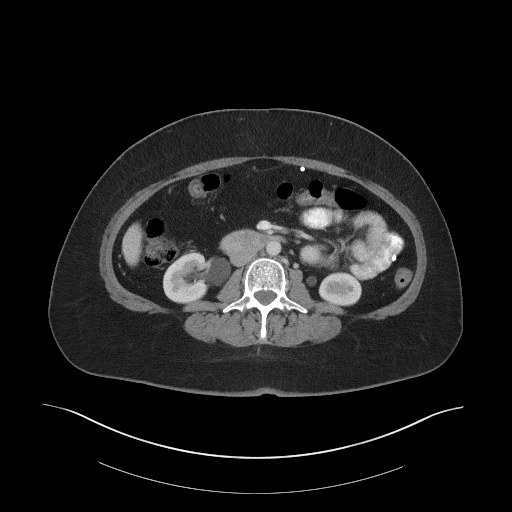
[im 60/94  soft-tissue]
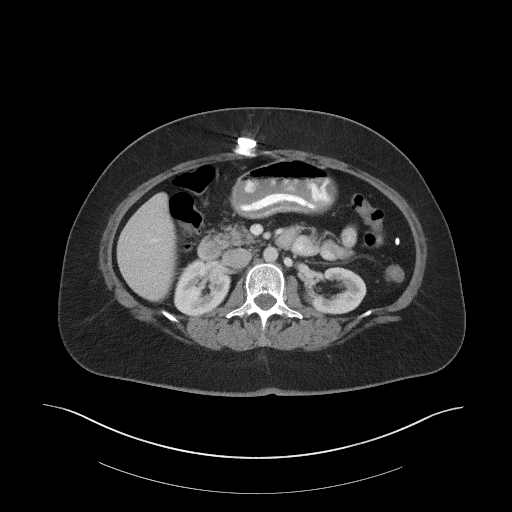
[im 60/94  bone]
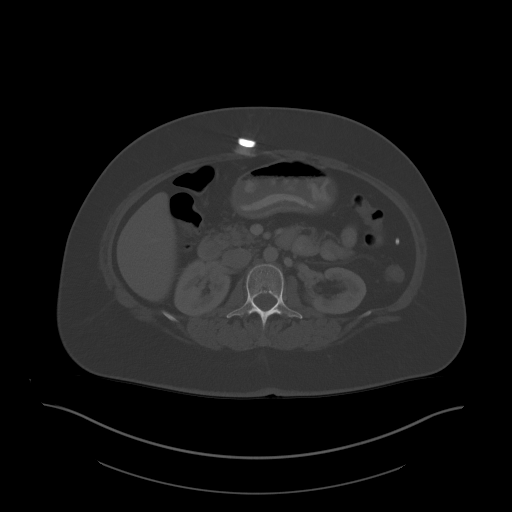
[im 67/94  soft-tissue]
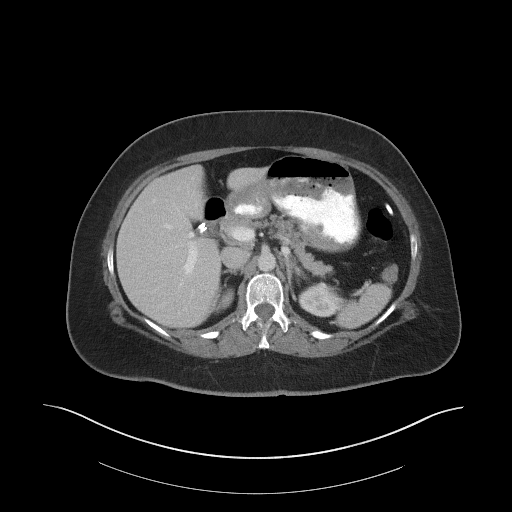
[im 74/94  soft-tissue]
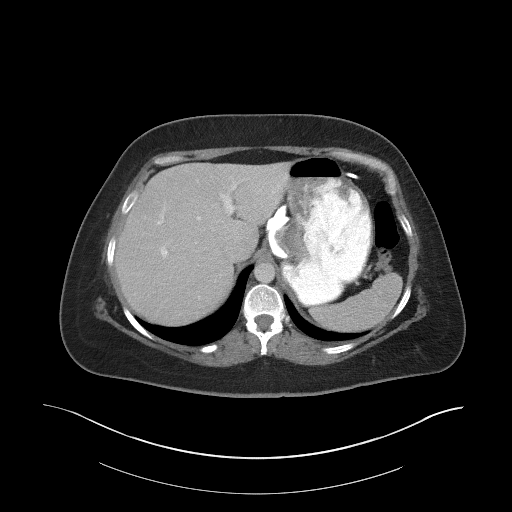
[im 80/94  soft-tissue]
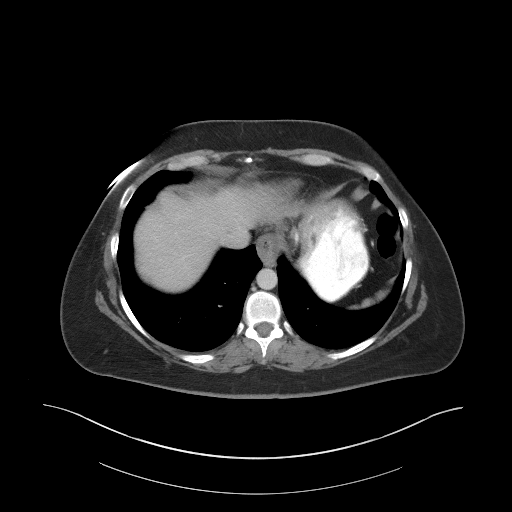
[im 87/94  soft-tissue]
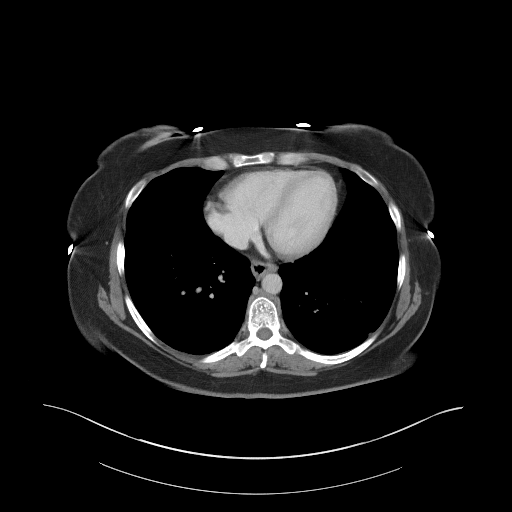

[Series 5: coronal st · coronal · 0.82mm/px · 3 of 94 slices shown]
[im 32/94  soft-tissue]
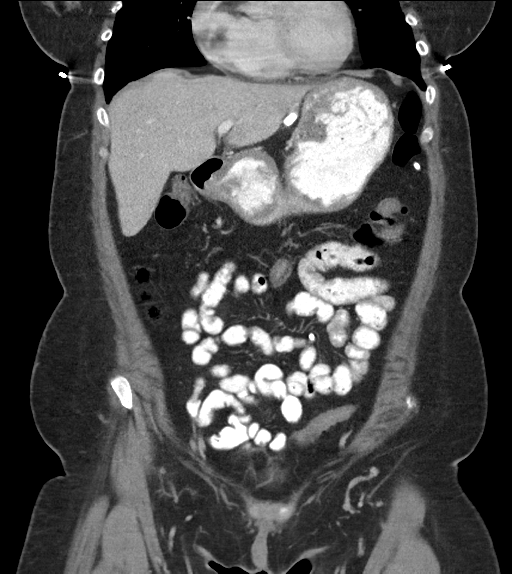
[im 42/94  soft-tissue]
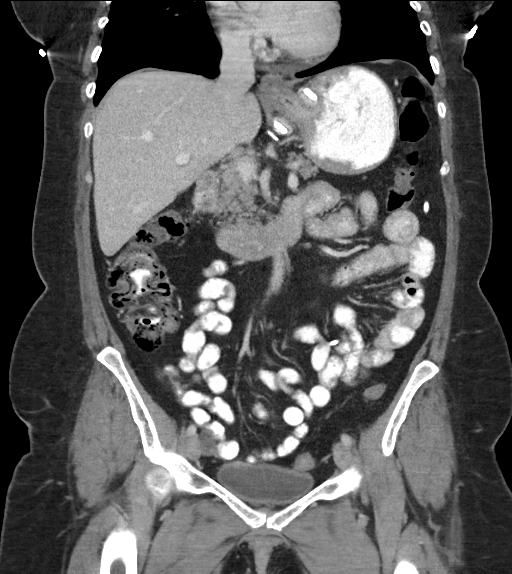
[im 52/94  soft-tissue]
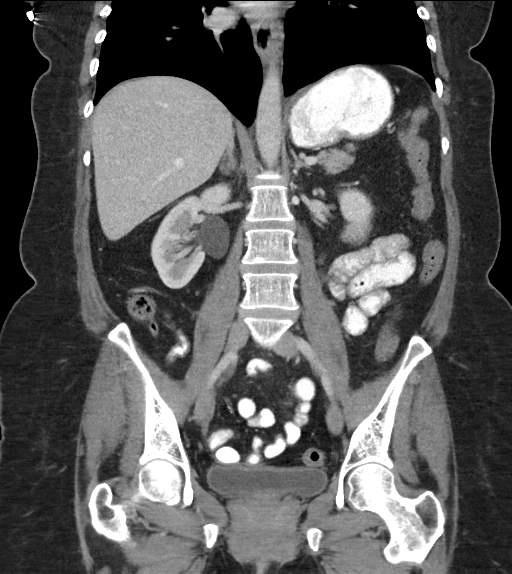

[16 of 46 positions shown; findings below may reference images not displayed]

FINDINGS: Lower chest: The visualized lung bases are clear.

No intra-abdominal free air or free fluid.

Hepatobiliary: The liver is unremarkable. No intrahepatic biliary
ductal dilatation. Cholecystectomy. No retained calcified stone
noted in the central CBD.

Pancreas: Unremarkable. No pancreatic ductal dilatation or
surrounding inflammatory changes.

Spleen: Normal in size without focal abnormality.

Adrenals/Urinary Tract: The adrenal glands are unremarkable. There
is no hydronephrosis on either side. There is extrarenal pelvis with
mild right pelviectasis. There is symmetric enhancement and
excretion of contrast by both kidneys. The visualized ureters and
urinary bladder appear unremarkable.

Stomach/Bowel: A gastric lap band is noted which appears in
appropriate orientation. There is no bowel obstruction or active
inflammation. The appendix is normal.

Vascular/Lymphatic: The abdominal aorta and IVC are unremarkable. No
portal venous gas. There is no adenopathy.

Reproductive: Hysterectomy. No pelvic mass.

Other: Anterior pelvic wall C-section scar.

Musculoskeletal: No acute or significant osseous findings.
IMPRESSION: 1. No acute intra-abdominal or pelvic pathology.
2. Gastric lap band in appropriate orientation. No bowel obstruction
or active inflammation. Normal appendix.

## 2019-10-26 DIAGNOSIS — Z6832 Body mass index (BMI) 32.0-32.9, adult: Secondary | ICD-10-CM | POA: Diagnosis not present

## 2019-10-26 DIAGNOSIS — M545 Low back pain: Secondary | ICD-10-CM | POA: Diagnosis not present

## 2019-10-26 DIAGNOSIS — M79671 Pain in right foot: Secondary | ICD-10-CM | POA: Diagnosis not present

## 2019-10-26 DIAGNOSIS — F3340 Major depressive disorder, recurrent, in remission, unspecified: Secondary | ICD-10-CM | POA: Diagnosis not present

## 2019-11-02 DIAGNOSIS — Z4651 Encounter for fitting and adjustment of gastric lap band: Secondary | ICD-10-CM | POA: Diagnosis not present

## 2020-01-15 ENCOUNTER — Ambulatory Visit: Payer: Self-pay | Attending: Internal Medicine

## 2020-01-15 DIAGNOSIS — Z23 Encounter for immunization: Secondary | ICD-10-CM | POA: Insufficient documentation

## 2020-01-15 NOTE — Progress Notes (Signed)
   Covid-19 Vaccination Clinic  Name:  Heather Gibbs    MRN: [id number] DOB: 1971/01/19  01/15/2020  Ms. [name] was observed post Covid-19 immunization for 15 minutes without incident. She was provided with Vaccine Information Sheet and instruction to access the V-Safe system.   Ms. [name] was instructed to call 911 with any severe reactions post vaccine: Marland Kitchen Difficulty breathing  . Swelling of face and throat  . A fast heartbeat  . A bad rash all over body  . Dizziness and weakness   Immunizations Administered    Name Date Dose VIS Date Route   Pfizer COVID-19 Vaccine 01/15/2020  3:06 PM 0.3 mL 10/21/2019 Intramuscular   Manufacturer: [place]   Lot: [id number]   [place]: [id number]

## 2020-02-15 ENCOUNTER — Ambulatory Visit: Payer: Self-pay | Attending: Internal Medicine

## 2020-02-15 DIAGNOSIS — Z23 Encounter for immunization: Secondary | ICD-10-CM

## 2020-02-15 NOTE — Progress Notes (Signed)
   Covid-19 Vaccination Clinic  Name:  SAMARRIA KINZEY    MRN: [id number] DOB: August 16, 1971  02/15/2020  Ms. [name] was observed post Covid-19 immunization for 15 minutes without incident. She was provided with Vaccine Information Sheet and instruction to access the V-Safe system.   Ms. [name] was instructed to call 911 with any severe reactions post vaccine: Marland Kitchen Difficulty breathing  . Swelling of face and throat  . A fast heartbeat  . A bad rash all over body  . Dizziness and weakness   Immunizations Administered    Name Date Dose VIS Date Route   Pfizer COVID-19 Vaccine 02/15/2020  8:53 AM 0.3 mL 10/21/2019 Intramuscular   Manufacturer: [place]   Lot: [id number]   [place]: [id number]

## 2020-04-11 ENCOUNTER — Telehealth (INDEPENDENT_AMBULATORY_CARE_PROVIDER_SITE_OTHER): Payer: Self-pay | Admitting: Internal Medicine

## 2020-04-11 NOTE — Telephone Encounter (Signed)
Spoke to patient, she states her hematocrit keeps dropping - she will need OV to discuss

## 2020-04-11 NOTE — Telephone Encounter (Signed)
Patient called stated last time she saw Dr Laural Golden he had talked about doing a procedure - she wants to go ahead and get it scheduled - she feels like she has something going on - please advise - ph# [phone]

## 2020-05-10 ENCOUNTER — Encounter (INDEPENDENT_AMBULATORY_CARE_PROVIDER_SITE_OTHER): Payer: Self-pay | Admitting: Gastroenterology

## 2020-05-10 ENCOUNTER — Other Ambulatory Visit: Payer: Self-pay

## 2020-05-10 ENCOUNTER — Ambulatory Visit (INDEPENDENT_AMBULATORY_CARE_PROVIDER_SITE_OTHER): Payer: No Typology Code available for payment source | Admitting: Gastroenterology

## 2020-05-10 VITALS — BP 113/75 | HR 87 | Temp 98.1°F | Ht 62.5 in | Wt 203.2 lb

## 2020-05-10 DIAGNOSIS — K589 Irritable bowel syndrome without diarrhea: Secondary | ICD-10-CM

## 2020-05-10 DIAGNOSIS — R194 Change in bowel habit: Secondary | ICD-10-CM | POA: Diagnosis not present

## 2020-05-10 MED ORDER — DICYCLOMINE HCL 10 MG PO CAPS: 10.0000 mg | ORAL_CAPSULE | Freq: Three times a day (TID) | ORAL | 3 refills | Status: DC | PRN

## 2020-05-10 MED ORDER — ESOMEPRAZOLE MAGNESIUM 40 MG PO CPDR: 40.0000 mg | DELAYED_RELEASE_CAPSULE | Freq: Every day | ORAL | 3 refills | Status: DC

## 2020-05-10 NOTE — Progress Notes (Signed)
Patient profile: Heather Gibbs is a 49 y.o. female seen for evaluation f/up of abd pain, last seen 06/2019.   History of Present Illness: Heather Gibbs is seen today for change in bowel habits. Misses several days without a BM then can have day of diarrhea, this has been ongoing over 6 months but worse over past 2 months. No med changes, diet changes when symptoms began. Can miss 3-4 days between stools prior to diarrhea begninning. Going outside and overheating can seem to trigger diarrhea. Can have 5-6 BM when diarrhea begins. Has very occasional BIS after constipation and straining. Reports constant stomach cramping unsure if more prominent w/ constipation or diarrhea. Use miralax every other day and colace 200mg  daily, hold when having diarrhea. Stools can vary from #2 to #6 on bristal stool scale.   Reports lab band placed 2010 and cramping seems initially around port site then radiates to lower abdomen.  Had 14mL placed in band a few months ago.    No nausea/vomiting. Occasional GERD but fairly well controlled on nexium 40mg . Denies dysphagia.  Occasional aleve. No tobacco or alcohol. Minimal sodas. Vry minimal dairy intake. Tried ot keep a food log without identifying any food triggers successfully.  Gaining weight desepite trying to loose weight. She does not eat meat.    She ws donating plasma (began about 6 months ago)-she has recently been deferred recently due to drop in Hgb.  Has not seen blood in her stool.  Wt Readings from Last 3 Encounters:  05/10/20 203 lb 3.2 oz (92.2 kg)  08/11/19 187 lb 4.8 oz (85 kg)  06/23/19 187 lb 4.8 oz (85 kg)     Last Colonoscopy: 08/2019-The entire examined colon is normal.                           - Internal hemorrhoids.                           - No specimens collected.    Last Endoscopy: 08/2019- Normal proximal esophagus and mid esophagus.                           - LA Grade B reflux esophagitis.                           -  Z-line regular, 34 cm from the incisors.                           - Esophageal polyp(s) were found. Resected and                            retrieved.                           - Extrinsic compression in the gastric body                            secondary to lap band.                           - Normal duodenal bulb and second portion of the  duodenum   Past Medical History:  Past Medical History:  Diagnosis Date   Anxiety    Complication of anesthesia    Depression    Family hx of colon cancer 11/19/2017   GERD (gastroesophageal reflux disease)    Headache    Lynch syndrome 11/19/2017   PONV (postoperative nausea and vomiting)    Restless leg    URI (upper respiratory infection)     Problem List: Patient Active Problem List   Diagnosis Date Noted   Abdominal pain 06/23/2019   Generalized abdominal pain 06/23/2019   Periodic limb movement disorder (PLMD) 05/16/2019   Loud snoring 05/16/2019   RLS (restless legs syndrome) 04/01/2019   Morning headache 04/01/2019   Subjective insomnia complaint 04/01/2019   Alteration of awareness 04/01/2019   Confusion. 72 hour EEG normal during episodes of confusion no seizures on EEG. 03/16/2019   Lynch syndrome 11/19/2017   Family hx of colon cancer 11/19/2017   Anxiety    Depression    Restless leg     Past Surgical History: Past Surgical History:  Procedure Laterality Date   ABDOMINAL HYSTERECTOMY     complete   BILATERAL OOPHORECTOMY     BREAST LUMPECTOMY Right    CHOLECYSTECTOMY     COLONOSCOPY N/A 02/18/2018   Procedure: COLONOSCOPY;  Surgeon: Rogene Houston, MD;  [facility];  Service: Endoscopy;  Laterality: N/A;  8:30   COLONOSCOPY N/A 08/11/2019   Procedure: COLONOSCOPY;  Surgeon: Rogene Houston, MD;  [facility]: [facility];  Service: Endoscopy;  Laterality: N/A;   DILATION AND CURETTAGE OF UTERUS     ESOPHAGOGASTRODUODENOSCOPY N/A 08/11/2019    Procedure: ESOPHAGOGASTRODUODENOSCOPY (EGD);  Surgeon: Rogene Houston, MD;  [facility]: [facility];  Service: Endoscopy;  Laterality: N/A;  2:00   LAPAROSCOPIC GASTRIC BANDING     2008   PARTIAL HYSTERECTOMY     POLYPECTOMY  08/11/2019   Procedure: POLYPECTOMY;  Surgeon: Rogene Houston, MD;  [facility]: [facility];  Service: Endoscopy;;  gastric/   TONSILLECTOMY      Allergies: Allergies  Allergen Reactions   Adhesive [Tape] Other (See Comments)    Blistering      Home Medications:  Current Outpatient Medications:    cetirizine (ZYRTEC) 10 MG tablet, Take 10 mg by mouth daily as needed for allergies., Disp: , Rfl:    cyclobenzaprine (FLEXERIL) 5 MG tablet, Take 10 mg by mouth 2 (two) times daily as needed for muscle spasms. , Disp: , Rfl:    docusate sodium (COLACE) 100 MG capsule, Take 200 mg by mouth daily., Disp: , Rfl:    escitalopram (LEXAPRO) 20 MG tablet, Take 20 mg by mouth at bedtime. , Disp: , Rfl: 0   esomeprazole (NEXIUM) 40 MG capsule, Take 1 capsule (40 mg total) by mouth daily before breakfast., Disp: 90 capsule, Rfl: 3   ferrous sulfate 325 (65 FE) MG tablet, Take 325 mg by mouth daily. , Disp: , Rfl:    fluticasone (FLONASE) 50 MCG/ACT nasal spray, Place 2 sprays into both nostrils daily. , Disp: , Rfl:    gabapentin (NEURONTIN) 300 MG capsule, Take 300 mg by mouth See admin instructions. Take 300 mg at bedtime, may take a second 300 mg dose during the day as needed for pain, Disp: , Rfl: 0   polyethylene glycol (MIRALAX / GLYCOLAX) 17 g packet, Take 17 g by mouth at bedtime., Disp: , Rfl:    traZODone (DESYREL) 100 MG tablet, Take 100 mg  by mouth at bedtime., Disp: , Rfl:    dicyclomine (BENTYL) 10 MG capsule, Take 1 capsule (10 mg total) by mouth 3 (three) times daily as needed (diarrhea)., Disp: 45 capsule, Rfl: 3   Family History: family history includes Brain cancer in her sister; Breast cancer in her maternal aunt, maternal grandmother, and  sister; COPD in her maternal aunt; Cancer - Lung in her mother; Chiari malformation in her daughter; Diabetes in her mother; Hypertension in her mother; Lung cancer in her mother and paternal grandmother; Seizures in her daughter and daughter.    Social History:   reports that she has never smoked. She has never used smokeless tobacco. She reports previous alcohol use. She reports that she does not use drugs.   Review of Systems: Constitutional: Denies weight loss/weight gain  Eyes: No changes in vision. ENT: No oral lesions, sore throat.  GI: see HPI.  Heme/Lymph: No easy bruising.  CV: No chest pain.  GU: No hematuria.  Integumentary: No rashes.  Neuro: No headaches.  Psych: No depression/anxiety.  Endocrine: No heat/cold intolerance.  Allergic/Immunologic: No urticaria.  Resp: No cough, SOB.  Musculoskeletal: No joint swelling.    Physical Examination: BP 113/75 (BP Location: Right Arm, Patient Position: Sitting, Cuff Size: Large)    Pulse 87    Temp 98.1 F (36.7 C) (Oral)    Ht 5' 2.5" (1.588 m)    Wt 203 lb 3.2 oz (92.2 kg)    BMI 36.57 kg/m  Gen: NAD, alert and oriented x 4 HEENT: PEERLA, EOMI, Neck: supple, no JVD Chest: CTA bilaterally, no wheezes, crackles, or other adventitious sounds CV: RRR, no m/g/c/r Abd: soft, NT, ND, +BS in all four quadrants; no HSM, guarding, ridigity, or rebound tenderness Ext: no edema, well perfused with 2+ pulses, Skin: no rash or lesions noted on observed skin Lymph: no noted LAD  Data Reviewed:  06/2019-IMPRESSION CT a/p  1. No acute intra-abdominal or pelvic pathology. 2. Gastric lap band in appropriate orientation. No bowel obstruction or active inflammation. Normal appendix.  IMPRESSION: 08/2019- UGI Lap band device in expected position. Otherwise unremarkable study.     Assessment/Plan: Ms. Schraeder is a 49 y.o. female with abdominal pain and alternating bowel habits.  1. Abdominal pain/change in bowel habits given  recent work-up with EGD, colonoscopy, CT unremarkable reviewed likely diagnosis is irritable bowel, may have degree of constipation with overflow diarrhea.  She is currently on stool softener and MiraLAX.  Will have her try low-dose Linzess to see if will improve symptoms.  We will also give her a prescription to use dicyclomine very intermittently as needed for diarrhea if symptomatic when doing errands, etc.   Samples of linzess 62mcg given-to call if needs script.    2. Anemia-Per patient noted on labs when she was donating plasma.  Will request copy of labs which her PCP has, if microcytic would consider iron studies.  She has been denied for future donations given low hemoglobin.  No obvious GI bleeding.  3.  History of Lynch-she would like to have yearly colonoscopies instead of every 2 years.  We will schedule at her 54-month follow-up for later in the fall.  Milayah was seen today for follow-up.  Diagnoses and all orders for this visit:  Irritable bowel syndrome, unspecified type  Change in bowel habits  Other orders -     dicyclomine (BENTYL) 10 MG capsule; Take 1 capsule (10 mg total) by mouth 3 (three) times daily as needed (diarrhea). -  esomeprazole (NEXIUM) 40 MG capsule; Take 1 capsule (40 mg total) by mouth daily before breakfast.     F/up 2 months.   I personally performed the service, non-incident to. (WP)  Laurine Blazer, [facility]

## 2020-05-10 NOTE — Patient Instructions (Signed)
Try probiotic such as Align or [employer] colon health - in addition to eating an apple a day. Can also try low dose linzess 95mcg daily - take 30 min before breakfast   Prescription for dicyclomine to use as needed sent to pharmacy for diarrhea.

## 2020-05-21 ENCOUNTER — Other Ambulatory Visit (INDEPENDENT_AMBULATORY_CARE_PROVIDER_SITE_OTHER): Payer: Self-pay | Admitting: Gastroenterology

## 2020-07-12 ENCOUNTER — Other Ambulatory Visit: Payer: Self-pay

## 2020-07-12 ENCOUNTER — Encounter (INDEPENDENT_AMBULATORY_CARE_PROVIDER_SITE_OTHER): Payer: Self-pay | Admitting: Gastroenterology

## 2020-07-12 ENCOUNTER — Ambulatory Visit (INDEPENDENT_AMBULATORY_CARE_PROVIDER_SITE_OTHER): Payer: No Typology Code available for payment source | Admitting: Gastroenterology

## 2020-07-12 VITALS — BP 124/74 | HR 64 | Temp 98.7°F | Ht 62.5 in | Wt 204.4 lb

## 2020-07-12 DIAGNOSIS — Z1509 Genetic susceptibility to other malignant neoplasm: Secondary | ICD-10-CM

## 2020-07-12 DIAGNOSIS — Z862 Personal history of diseases of the blood and blood-forming organs and certain disorders involving the immune mechanism: Secondary | ICD-10-CM

## 2020-07-12 DIAGNOSIS — Z800 Family history of malignant neoplasm of digestive organs: Secondary | ICD-10-CM

## 2020-07-12 DIAGNOSIS — K219 Gastro-esophageal reflux disease without esophagitis: Secondary | ICD-10-CM

## 2020-07-12 MED ORDER — LINACLOTIDE 145 MCG PO CAPS: 145.0000 ug | ORAL_CAPSULE | Freq: Every day | ORAL | 3 refills | Status: AC

## 2020-07-12 NOTE — Patient Instructions (Signed)
We are checking CBC and iron studies for evaluation. Continue linzess 127mcg. We will schedule egd/colonsocopy

## 2020-07-12 NOTE — Progress Notes (Signed)
Patient profile: Heather Gibbs is a 49 y.o. female seen for follow up.  History of Present Illness: Heather Gibbs is seen today for follow-up.  She reports the Linzess samples we gave her drastically helped her symptoms.  We gave her 72 mcg which was helping, she does take 2 some days to total 145 and feels this works better.  Currently having bowel movement about every other day.  Drastic improvement in abdominal pain with trying Linzess.  She feels it is really working well.  Denies rectal bleeding or melena, lower abdominal pain.  She denies any specific upper GI complaints except some discomfort and tenderness around her prior LAP-BAND port site.  She has never felt GERD despite having grade B esophagitis on last endoscopy.  She is compliant with her daily Nexium.  She denies any nausea or vomiting.  No dysphagia.  Has gained weight over the last year.  Wt Readings from Last 3 Encounters:  07/12/20 204 lb 6.4 oz (92.7 kg)  05/10/20 203 lb 3.2 oz (92.2 kg)  08/11/19 187 lb 4.8 oz (85 kg)    Last Colonoscopy: 08/2019-The entire examined colon is normal. - Internal hemorrhoids. - No specimens collected.    Last Endoscopy: 08/2019-Normal proximal esophagus and mid esophagus. - LA Grade B reflux esophagitis. - Z-line regular, 34 cm from the incisors. - Esophageal polyp(s) were found. Resected and retrieved. - Extrinsic compression in the gastric body secondary to lap band. - Normal duodenal bulb and second portion of theduodenum     Past Medical History:  Past Medical History:  Diagnosis Date  . Anxiety   . Complication of anesthesia   . Depression   . Family hx of colon cancer 11/19/2017  . GERD (gastroesophageal reflux disease)   . Headache   . Lynch syndrome 11/19/2017  . PONV (postoperative nausea and vomiting)   . Restless leg   . URI (upper respiratory infection)     Problem List: Patient Active  Problem List   Diagnosis Date Noted  . Abdominal pain 06/23/2019  . Generalized abdominal pain 06/23/2019  . Periodic limb movement disorder (PLMD) 05/16/2019  . Loud snoring 05/16/2019  . RLS (restless legs syndrome) 04/01/2019  . Morning headache 04/01/2019  . Subjective insomnia complaint 04/01/2019  . Alteration of awareness 04/01/2019  . Confusion. 72 hour EEG normal during episodes of confusion no seizures on EEG. 03/16/2019  . Lynch syndrome 11/19/2017  . Family hx of colon cancer 11/19/2017  . Anxiety   . Depression   . Restless leg     Past Surgical History: Past Surgical History:  Procedure Laterality Date  . ABDOMINAL HYSTERECTOMY     complete  . BILATERAL OOPHORECTOMY    . BREAST LUMPECTOMY Right   . CHOLECYSTECTOMY    . COLONOSCOPY N/A 02/18/2018   Procedure: COLONOSCOPY;  Surgeon: Rogene Houston, MD;  [facility];  Service: Endoscopy;  Laterality: N/A;  8:30  . COLONOSCOPY N/A 08/11/2019   Procedure: COLONOSCOPY;  Surgeon: Rogene Houston, MD;  [facility];  Service: Endoscopy;  Laterality: N/A;  . DILATION AND CURETTAGE OF UTERUS    . ESOPHAGOGASTRODUODENOSCOPY N/A 08/11/2019   Procedure: ESOPHAGOGASTRODUODENOSCOPY (EGD);  Surgeon: Rogene Houston, MD;  [facility];  Service: Endoscopy;  Laterality: N/A;  2:00  . LAPAROSCOPIC GASTRIC BANDING     2008  . PARTIAL HYSTERECTOMY    . POLYPECTOMY  08/11/2019   Procedure: POLYPECTOMY;  Surgeon: Rogene Houston, MD;  [facility];  Service: Endoscopy;;  gastric/  . TONSILLECTOMY      Allergies: Allergies  Allergen Reactions  . Adhesive [Tape] Other (See Comments)    Blistering      Home Medications:  Current Outpatient Medications:  .  cetirizine (ZYRTEC) 10 MG tablet, Take 10 mg by mouth daily as needed for allergies., Disp: , Rfl:  .  cyclobenzaprine (FLEXERIL) 5 MG tablet, Take 10 mg by mouth 2 (two) times daily as needed for muscle spasms. , Disp: , Rfl:  .   escitalopram (LEXAPRO) 20 MG tablet, Take 20 mg by mouth at bedtime. , Disp: , Rfl: 0 .  esomeprazole (NEXIUM) 40 MG capsule, Take 1 capsule (40 mg total) by mouth daily before breakfast., Disp: 90 capsule, Rfl: 3 .  fluticasone (FLONASE) 50 MCG/ACT nasal spray, Place 2 sprays into both nostrils daily. , Disp: , Rfl:  .  gabapentin (NEURONTIN) 300 MG capsule, Take 300 mg by mouth See admin instructions. Take 300 mg at bedtime, may take a second 300 mg dose during the day as needed for pain, Disp: , Rfl: 0 .  traZODone (DESYREL) 100 MG tablet, Take 100 mg by mouth at bedtime., Disp: , Rfl:  .  dicyclomine (BENTYL) 10 MG capsule, TAKE 1 CAPSULE (10 MG TOTAL) BY MOUTH 3 (THREE) TIMES DAILY AS NEEDED (DIARRHEA). (Patient not taking: Reported on 07/12/2020), Disp: 45 capsule, Rfl: 3 .  docusate sodium (COLACE) 100 MG capsule, Take 200 mg by mouth daily. (Patient not taking: Reported on 07/12/2020), Disp: , Rfl:  .  ferrous sulfate 325 (65 FE) MG tablet, Take 325 mg by mouth daily.  (Patient not taking: Reported on 07/12/2020), Disp: , Rfl:  .  linaclotide (LINZESS) 145 MCG CAPS capsule, Take 1 capsule (145 mcg total) by mouth daily before breakfast., Disp: 90 capsule, Rfl: 3 .  polyethylene glycol (MIRALAX / GLYCOLAX) 17 g packet, Take 17 g by mouth at bedtime. (Patient not taking: Reported on 07/12/2020), Disp: , Rfl:    Family History: family history includes Brain cancer in her sister; Breast cancer in her maternal aunt, maternal grandmother, and sister; COPD in her maternal aunt; Cancer - Lung in her mother; Chiari malformation in her daughter; Diabetes in her mother; Hypertension in her mother; Lung cancer in her mother and paternal grandmother; Seizures in her daughter and daughter.    Social History:   reports that she has never smoked. She has never used smokeless tobacco. She reports previous alcohol use. She reports that she does not use drugs.   Review of Systems: Constitutional: Positive for weight  gain  Eyes: No changes in vision. ENT: No oral lesions, sore throat.  GI: see HPI.  Heme/Lymph: No easy bruising.  CV: No chest pain.  GU: No hematuria.  Integumentary: No rashes.  Neuro: No headaches.  Psych: No depression/anxiety.  Endocrine: No heat/cold intolerance.  Allergic/Immunologic: No urticaria.  Resp: No cough, SOB.  Musculoskeletal: No joint swelling.    Physical Examination: BP 124/74 (BP Location: Left Arm, Patient Position: Sitting, Cuff Size: Large)   Pulse 64   Temp 98.7 F (37.1 C) (Oral)   Ht 5' 2.5" (1.588 m)   Wt 204 lb 6.4 oz (92.7 kg)   BMI 36.79 kg/m  Gen: NAD, alert and oriented x 4 HEENT: PEERLA, EOMI, Neck: supple, no JVD Chest: CTA bilaterally, no wheezes, crackles, or other adventitious sounds CV: RRR, no m/g/c/r Abd: soft, NT, ND, +BS in all four quadrants; no HSM, guarding,  ridigity, or rebound tenderness Ext: no edema, well perfused with 2+ pulses, Skin: no rash or lesions noted on observed skin Lymph: no noted LAD  Data Reviewed:  04/13/20-Labs show hemoglobin 1.5, MCV 96, white count 8.4, normal R83 and folic acid.  CMP normal.   Assessment/Plan: Ms. Menees is a 49 y.o. female seen for follow-up  1.  Constipation-drastic improvement with Linzess 72 mcg & would like to try 145 mcg as still has some straining on 72 mcg dose. Rx sent to pharmacy   2.  Lynch syndrome patient prefers yearly colonoscopies given strong family history of colon cancer (father with colon cancer in 58s, paternal grandfather colon cancer 68s, sister colon cancer 37s). Last 08/2019.   3.  GERD-she had a history of grade B esophagitis on her last endoscopy and did not have any significant GERD symptoms prior.  She does have some upper abdominal pain but suspect this relates to discomfort around her prior port site of her LAP-BAND.  She has met with surgery and discussed getting removed.  She would like to add on upper endoscopy to colonoscopy.  4.  Anemia-patient  was told when attempting to donate plasma that she was anemic.  Her last hemoglobin was 3 months ago.  We will repeat hemoglobin and iron studies today for further clarification.  Patient denies CP, SOB, and use of blood thinners. I discussed the risks and benefits of procedure including bleeding, perforation, infection, missed lesions, medication reactions and possible hospitalization or surgery if complications. All questions answered.  Issues with sedation.   Rosemary was seen today for follow-up.  Diagnoses and all orders for this visit:  Chronic GERD  Lynch syndrome  Family hx of colon cancer  History of anemia -     CBC with Differential -     Fe+TIBC+Fer  Other orders -     linaclotide (LINZESS) 145 MCG CAPS capsule; Take 1 capsule (145 mcg total) by mouth daily before breakfast.     I personally performed the service, non-incident to. (WP)  Laurine Blazer, [facility]

## 2020-07-13 LAB — CBC WITH DIFFERENTIAL/PLATELET
Absolute Monocytes: 554 cells/uL (ref 200–950)
Basophils Absolute: 28 cells/uL (ref 0–200)
Basophils Relative: 0.5 %
Eosinophils Absolute: 129 cells/uL (ref 15–500)
Eosinophils Relative: 2.3 %
HCT: 37.1 % (ref 35.0–45.0)
Hemoglobin: 12.3 g/dL (ref 11.7–15.5)
Lymphs Abs: 1977 cells/uL (ref 850–3900)
MCH: 31.7 pg (ref 27.0–33.0)
MCHC: 33.2 g/dL (ref 32.0–36.0)
MCV: 95.6 fL (ref 80.0–100.0)
MPV: 10.1 fL (ref 7.5–12.5)
Monocytes Relative: 9.9 %
Neutro Abs: 2912 cells/uL (ref 1500–7800)
Neutrophils Relative %: 52 %
Platelets: 282 10*3/uL (ref 140–400)
RBC: 3.88 10*6/uL (ref 3.80–5.10)
RDW: 11.8 % (ref 11.0–15.0)
Total Lymphocyte: 35.3 %
WBC: 5.6 10*3/uL (ref 3.8–10.8)

## 2020-07-13 LAB — IRON,TIBC AND FERRITIN PANEL
%SAT: 45 % (calc) (ref 16–45)
Ferritin: 14 ng/mL — ABNORMAL LOW (ref 16–232)
Iron: 136 ug/dL (ref 40–190)
TIBC: 302 mcg/dL (calc) (ref 250–450)

## 2020-07-18 ENCOUNTER — Telehealth (INDEPENDENT_AMBULATORY_CARE_PROVIDER_SITE_OTHER): Payer: Self-pay | Admitting: *Deleted

## 2020-07-18 ENCOUNTER — Other Ambulatory Visit (INDEPENDENT_AMBULATORY_CARE_PROVIDER_SITE_OTHER): Payer: Self-pay | Admitting: *Deleted

## 2020-07-18 ENCOUNTER — Encounter (INDEPENDENT_AMBULATORY_CARE_PROVIDER_SITE_OTHER): Payer: Self-pay | Admitting: *Deleted

## 2020-07-18 MED ORDER — SUTAB 1479-225-188 MG PO TABS: 1.0000 | ORAL_TABLET | Freq: Once | ORAL | 0 refills | Status: AC

## 2020-07-18 NOTE — Telephone Encounter (Signed)
Patient needs [employer] (copay card) ° °

## 2020-09-04 ENCOUNTER — Other Ambulatory Visit (HOSPITAL_COMMUNITY)
Admission: RE | Admit: 2020-09-04 | Discharge: 2020-09-04 | Disposition: A | Discharge status: Home / Self Care | Payer: No Typology Code available for payment source | Source: Ambulatory Visit | Attending: Internal Medicine | Admitting: Internal Medicine

## 2020-09-04 ENCOUNTER — Other Ambulatory Visit: Payer: Self-pay

## 2020-09-04 DIAGNOSIS — Z01812 Encounter for preprocedural laboratory examination: Secondary | ICD-10-CM | POA: Diagnosis present

## 2020-09-04 DIAGNOSIS — Z20822 Contact with and (suspected) exposure to covid-19: Secondary | ICD-10-CM | POA: Diagnosis not present

## 2020-09-05 LAB — SARS CORONAVIRUS 2 (TAT 6-24 HRS): SARS Coronavirus 2: NEGATIVE

## 2020-09-06 ENCOUNTER — Encounter (HOSPITAL_COMMUNITY)
Admission: RE | Disposition: A | Discharge status: Home / Self Care | Payer: Self-pay | Source: Home / Self Care | Attending: Internal Medicine

## 2020-09-06 ENCOUNTER — Ambulatory Visit (HOSPITAL_COMMUNITY)
Admission: RE | Admit: 2020-09-06 | Discharge: 2020-09-06 | Disposition: A | Discharge status: Home / Self Care | Payer: No Typology Code available for payment source | Attending: Internal Medicine | Admitting: Internal Medicine

## 2020-09-06 ENCOUNTER — Other Ambulatory Visit: Payer: Self-pay

## 2020-09-06 ENCOUNTER — Encounter (HOSPITAL_COMMUNITY): Payer: Self-pay | Admitting: Internal Medicine

## 2020-09-06 DIAGNOSIS — K3189 Other diseases of stomach and duodenum: Secondary | ICD-10-CM

## 2020-09-06 DIAGNOSIS — K644 Residual hemorrhoidal skin tags: Secondary | ICD-10-CM | POA: Insufficient documentation

## 2020-09-06 DIAGNOSIS — K219 Gastro-esophageal reflux disease without esophagitis: Secondary | ICD-10-CM

## 2020-09-06 DIAGNOSIS — K2289 Other specified disease of esophagus: Secondary | ICD-10-CM | POA: Insufficient documentation

## 2020-09-06 DIAGNOSIS — K449 Diaphragmatic hernia without obstruction or gangrene: Secondary | ICD-10-CM | POA: Diagnosis not present

## 2020-09-06 DIAGNOSIS — Z1509 Genetic susceptibility to other malignant neoplasm: Secondary | ICD-10-CM | POA: Diagnosis not present

## 2020-09-06 DIAGNOSIS — K2990 Gastroduodenitis, unspecified, without bleeding: Secondary | ICD-10-CM

## 2020-09-06 DIAGNOSIS — Z09 Encounter for follow-up examination after completed treatment for conditions other than malignant neoplasm: Secondary | ICD-10-CM | POA: Diagnosis not present

## 2020-09-06 DIAGNOSIS — Z1211 Encounter for screening for malignant neoplasm of colon: Secondary | ICD-10-CM | POA: Diagnosis not present

## 2020-09-06 DIAGNOSIS — Z800 Family history of malignant neoplasm of digestive organs: Secondary | ICD-10-CM | POA: Diagnosis not present

## 2020-09-06 DIAGNOSIS — K2970 Gastritis, unspecified, without bleeding: Secondary | ICD-10-CM

## 2020-09-06 HISTORY — PX: COLONOSCOPY: SHX5424

## 2020-09-06 HISTORY — PX: ESOPHAGOGASTRODUODENOSCOPY: SHX5428

## 2020-09-06 SURGERY — COLONOSCOPY
Anesthesia: Moderate Sedation

## 2020-09-06 MED ORDER — STERILE WATER FOR IRRIGATION IR SOLN
Status: DC | PRN
  Administered 2020-09-06: 1.5 mL

## 2020-09-06 MED ORDER — MEPERIDINE HCL 50 MG/ML IJ SOLN
INTRAMUSCULAR | Status: AC
  Filled 2020-09-06: qty 1

## 2020-09-06 MED ORDER — MEPERIDINE HCL 50 MG/ML IJ SOLN
INTRAMUSCULAR | Status: DC | PRN
Start: 2020-09-06 — End: 2020-09-06
  Administered 2020-09-06 (×3): 25 mg via INTRAVENOUS

## 2020-09-06 MED ORDER — PROMETHAZINE HCL 25 MG/ML IJ SOLN
INTRAMUSCULAR | Status: DC | PRN
  Administered 2020-09-06: 6.25 mg via INTRAVENOUS

## 2020-09-06 MED ORDER — LIDOCAINE VISCOUS HCL 2 % MT SOLN
OROMUCOSAL | Status: DC | PRN
  Administered 2020-09-06: 4 mL via OROMUCOSAL

## 2020-09-06 MED ORDER — SODIUM CHLORIDE 0.9 % IV SOLN: INTRAVENOUS | Status: DC

## 2020-09-06 MED ORDER — LIDOCAINE VISCOUS HCL 2 % MT SOLN
OROMUCOSAL | Status: AC
  Filled 2020-09-06: qty 15

## 2020-09-06 MED ORDER — PROMETHAZINE HCL 25 MG/ML IJ SOLN
INTRAMUSCULAR | Status: AC
  Filled 2020-09-06: qty 1

## 2020-09-06 MED ORDER — MIDAZOLAM HCL 5 MG/5ML IJ SOLN
INTRAMUSCULAR | Status: DC | PRN
  Administered 2020-09-06 (×5): 2 mg via INTRAVENOUS

## 2020-09-06 MED ORDER — MIDAZOLAM HCL 5 MG/5ML IJ SOLN
INTRAMUSCULAR | Status: AC
  Filled 2020-09-06: qty 10

## 2020-09-06 MED ORDER — SODIUM CHLORIDE FLUSH 0.9 % IV SOLN
INTRAVENOUS | Status: AC
  Filled 2020-09-06: qty 10

## 2020-09-06 NOTE — H&P (Addendum)
Heather Gibbs is an 49 y.o. female.   Chief Complaint: Patient is here for esophagogastroduodenoscopy and colonoscopy. HPI: Patient is 49 year old Caucasian female who has a history of erosive reflux esophagitis as well as history of Lynch syndrome who is here for diagnostic esophagogastroduodenoscopy and high rescreening colonoscopy.  Last colonoscopy was 1 year ago.  She is reluctant to wait 2 years.  She denies abdominal pain change in bowel habits or rectal bleeding. Heartburn is well controlled with PPI.  She denies dysphagia.  If esophagitis is healed would consider dropping PPI dose. Family history is positive for colon carcinoma and in her father who was in his 17s at the time of diagnosis and doing fine in his 35s paternal grandfather also had colon carcinoma he was in his 52s.  He has passed away.  Another sister has been treated with colon carcinoma and she was in her late 34s or early 29s.  Another sister has been diagnosed with pancreatic carcinoma but patient does not know how she is doing.  Past Medical History:  Diagnosis Date  . Anxiety   . Complication of anesthesia   . Depression   . Family hx of colon cancer 11/19/2017  . GERD (gastroesophageal reflux disease)   . Headache   . Lynch syndrome 11/19/2017  . PONV (postoperative nausea and vomiting)   . Restless leg   . URI (upper respiratory infection)     Past Surgical History:  Procedure Laterality Date  . ABDOMINAL HYSTERECTOMY     complete  . BILATERAL OOPHORECTOMY    . BREAST LUMPECTOMY Right   . CHOLECYSTECTOMY    . COLONOSCOPY N/A 02/18/2018   Procedure: COLONOSCOPY;  Surgeon: Rogene Houston, MD;  [facility];  Service: Endoscopy;  Laterality: N/A;  8:30  . COLONOSCOPY N/A 08/11/2019   Procedure: COLONOSCOPY;  Surgeon: Rogene Houston, MD;  Location: [facility];  Service: Endoscopy;  Laterality: N/A;  . DILATION AND CURETTAGE OF UTERUS    . ESOPHAGOGASTRODUODENOSCOPY N/A 08/11/2019    Procedure: ESOPHAGOGASTRODUODENOSCOPY (EGD);  Surgeon: Rogene Houston, MD;  [facility]: [facility];  Service: Endoscopy;  Laterality: N/A;  2:00  . LAPAROSCOPIC GASTRIC BANDING     2008  . PARTIAL HYSTERECTOMY    . POLYPECTOMY  08/11/2019   Procedure: POLYPECTOMY;  Surgeon: Rogene Houston, MD;  Location: [facility];  Service: Endoscopy;;  gastric/  . TONSILLECTOMY      Family History  Problem Relation Age of Onset  . Hypertension Mother   . Diabetes Mother   . Lung cancer Mother   . Cancer - Lung Mother   . Brain cancer Sister   . Breast cancer Sister   . Breast cancer Maternal Grandmother   . Lung cancer Paternal Grandmother   . COPD Maternal Aunt   . Breast cancer Maternal Aunt   . Seizures Daughter        grand mal, as a young child  . Seizures Daughter        petite mal, as a young child   . Chiari malformation Daughter    Social History:  reports that she has never smoked. She has never used smokeless tobacco. She reports previous alcohol use. She reports that she does not use drugs.  Allergies:  Allergies  Allergen Reactions  . Adhesive [Tape] Other (See Comments)    Blistering    Medications Prior to Admission  Medication Sig Dispense Refill  . cetirizine (ZYRTEC) 10 MG tablet Take 10  mg by mouth daily as needed for allergies.    . cholecalciferol (QC VITAMIN D3) 10 MCG (400 UNIT) TABS tablet Take 800 Units by mouth daily.    Marland Kitchen escitalopram (LEXAPRO) 20 MG tablet Take 20 mg by mouth at bedtime.   0  . esomeprazole (NEXIUM) 40 MG capsule Take 1 capsule (40 mg total) by mouth daily before breakfast. 90 capsule 3  . ferrous sulfate 325 (65 FE) MG tablet Take 325 mg by mouth daily.     Marland Kitchen gabapentin (NEURONTIN) 300 MG capsule Take 300 mg by mouth at bedtime. Take 300 mg at bedtime, may take a second 300 mg dose during the day as needed for pain  0  . ibuprofen (ADVIL) 200 MG tablet Take 400 mg by mouth every 8 (eight) hours as needed (for pain.).    Marland Kitchen linaclotide  (LINZESS) 145 MCG CAPS capsule Take 1 capsule (145 mcg total) by mouth daily before breakfast. 90 capsule 3  . Multiple Vitamin (MULTIVITAMIN WITH MINERALS) TABS tablet Take 1 tablet by mouth daily.    . traZODone (DESYREL) 100 MG tablet Take 50 mg by mouth at bedtime.     . cyclobenzaprine (FLEXERIL) 10 MG tablet Take 10 mg by mouth 2 (two) times daily as needed for muscle spasms.     Marland Kitchen dicyclomine (BENTYL) 10 MG capsule TAKE 1 CAPSULE (10 MG TOTAL) BY MOUTH 3 (THREE) TIMES DAILY AS NEEDED (DIARRHEA). (Patient taking differently: Take 10 mg by mouth 3 (three) times daily as needed (diarrhea). Diarrhea/abdominal spasms.) 45 capsule 3  . fluticasone (FLONASE) 50 MCG/ACT nasal spray Place 2 sprays into both nostrils daily as needed for allergies.       Results for orders placed or performed during the hospital encounter of 09/04/20 (from the past 48 hour(s))  SARS CORONAVIRUS 2 (TAT 6-24 HRS) Nasopharyngeal Nasopharyngeal Swab     Status: None   Collection Time: 09/04/20  3:40 PM   Specimen: Nasopharyngeal Swab  Result Value Ref Range   SARS Coronavirus 2 NEGATIVE NEGATIVE    Comment: (NOTE) SARS-CoV-2 target nucleic acids are NOT DETECTED.  The SARS-CoV-2 RNA is generally detectable in upper and lower respiratory specimens during the acute phase of infection. Negative results do not preclude SARS-CoV-2 infection, do not rule out co-infections with other pathogens, and should not be used as the sole basis for treatment or other patient management decisions. Negative results must be combined with clinical observations, patient history, and epidemiological information. The expected result is Negative.  Fact Sheet for Patients: SugarRoll.be  Fact Sheet for Healthcare Providers: https://www.woods-mathews.com/  This test is not yet approved or cleared by the [place] FDA and  has been authorized for detection and/or diagnosis of SARS-CoV-2  by FDA under an Emergency Use Authorization (EUA). This EUA will remain  in effect (meaning this test can be used) for the duration of the COVID-19 declaration under Se ction 564(b)(1) of the Act, 21 U.S.C. section 360bbb-3(b)(1), unless the authorization is terminated or revoked sooner.  Performed at [facility], [place] [address], [place], [place] [address]    No results found.  Review of Systems  Blood pressure 110/71, pulse 69, temperature 98.8 F (37.1 C), temperature source Oral, resp. rate 14, height 5\' 3"  (1.6 m), weight 90.7 kg, SpO2 98 %. Physical Exam HENT:     Mouth/Throat:     Mouth: Mucous membranes are moist.     Pharynx: Oropharynx is clear.  Eyes:     General: No  scleral icterus.    Conjunctiva/sclera: Conjunctivae normal.  Cardiovascular:     Rate and Rhythm: Normal rate and regular rhythm.     Heart sounds: Normal heart sounds. No murmur heard.   Pulmonary:     Effort: Pulmonary effort is normal.     Breath sounds: Normal breath sounds.  Abdominal:     General: There is no distension.     Palpations: Abdomen is soft. There is no mass.     Tenderness: There is no abdominal tenderness.  Musculoskeletal:        General: No swelling.     Cervical back: Neck supple.  Lymphadenopathy:     Cervical: No cervical adenopathy.  Skin:    General: Skin is warm and dry.  Neurological:     Mental Status: She is alert.      Assessment/Plan  Follow-up for GERD.  History of grade B reflux esophagitis. She carries gene for Lynch syndrome.  Family history positive for colon carcinoma in multiple first-degree and one secondary second-degree relative. Diagnostic esophagogastroduodenoscopy and high risk screening colonoscopy.  Hildred Laser, MD 09/06/2020, 11:31 AM

## 2020-09-06 NOTE — Op Note (Signed)
[facility] Patient Name: Divya Munshi Procedure Date: 09/06/2020 11:20 AM MRN: [id number] Date of Birth: January 26, 1971 Attending MD: Hildred Laser , MD CSN: [id number] Age: 49 Admit Type: Outpatient Procedure:                Upper GI endoscopy Indications:              Follow-up of gastro-esophageal reflux disease Providers:                Hildred Laser, MD, Otis Peak B. Sharon Seller, RN, Raphael Gibney, Technician Referring MD:             Stoney Bang, MD Medicines:                Lidocainejelly, Meperidine 75 mg IV, Midazolam 8 mg                            IV, Promethazine 3.53 mg IV Complications:            No immediate complications. Estimated Blood Loss:     Estimated blood loss: none. Procedure:                Pre-Anesthesia Assessment:                           - Prior to the procedure, a History and Physical                            was performed, and patient medications and                            allergies were reviewed. The patient's tolerance of                            previous anesthesia was also reviewed. The risks                            and benefits of the procedure and the sedation                            options and risks were discussed with the patient.                            All questions were answered, and informed consent                            was obtained. Prior Anticoagulants: The patient has                            taken no previous anticoagulant or antiplatelet                            agents except for NSAID medication. ASA Grade  Assessment: II - A patient with mild systemic                            disease. After reviewing the risks and benefits,                            the patient was deemed in satisfactory condition to                            undergo the procedure.                           After obtaining informed consent, the endoscope was                             passed under direct vision. Throughout the                            procedure, the patient's blood pressure, pulse, and                            oxygen saturations were monitored continuously. The                            GIF-H190 ([id number]) scope was introduced through the                            mouth, and advanced to the second part of duodenum.                            The upper GI endoscopy was accomplished without                            difficulty. The patient tolerated the procedure                            well. Scope In: 11:50:44 AM Scope Out: 52:84:13 AM Total Procedure Duration: 0 hours 5 minutes 25 seconds  Findings:      The hypopharynx was normal.      The examined esophagus was normal.      The Z-line was irregular and was found 35 cm from the incisors.      A 3 cm hiatal hernia was present.      A few erosions with no stigmata of recent bleeding were found in the       gastric antrum.      The exam of the stomach was otherwise normal.      The duodenal bulb and second portion of the duodenum were normal. Impression:               - Normal hypopharynx.                           - Normal esophagus.                           -  Z-line irregular, 35 cm from the incisors.                           - 3 cm hiatal hernia.                           - Erosive gastropathy with no stigmata of recent                            bleeding.                           - Normal duodenal bulb and second portion of the                            duodenum.                           - No specimens collected.                           Comment: Erosive esophagitis has healed since his                            last exam of 1 year ago. Moderate Sedation:      Moderate (conscious) sedation was administered by the endoscopy nurse       and supervised by the endoscopist. The following parameters were       monitored: oxygen saturation, heart rate, blood pressure, CO2        capnography and response to care. Total physician intraservice time was       17 minutes. Recommendation:           - Patient has a contact number available for                            emergencies. The signs and symptoms of potential                            delayed complications were discussed with the                            patient. Return to normal activities tomorrow.                            Written discharge instructions were provided to the                            patient.                           - Resume previous diet today.                           - Continue present medications. Procedure Code(s):        --- Professional ---  98338, Esophagogastroduodenoscopy, flexible,                            transoral; diagnostic, including collection of                            specimen(s) by brushing or washing, when performed                            (separate procedure)                           G0500, Moderate sedation services provided by the                            same physician or other qualified health care                            professional performing a gastrointestinal                            endoscopic service that sedation supports,                            requiring the presence of an independent trained                            observer to assist in the monitoring of the                            patient's level of consciousness and physiological                            status; initial 15 minutes of intra-service time;                            patient age 32 years or older (additional time may                            be reported with [phone], as appropriate) Diagnosis Code(s):        --- Professional ---                           K22.8, Other specified diseases of esophagus                           K44.9, Diaphragmatic hernia without obstruction or                            gangrene                            K31.89, Other diseases of stomach and duodenum                           K21.9, Gastro-esophageal reflux  disease without                            esophagitis CPT copyright 2019 American Medical Association. All rights reserved. The codes documented in this report are preliminary and upon coder review may  be revised to meet current compliance requirements. Hildred Laser, MD Hildred Laser, MD 09/06/2020 12:30:35 PM This report has been signed electronically. Number of Addenda: 0

## 2020-09-06 NOTE — Discharge Instructions (Signed)
Resume usual medications as before.  Can try Nexium every other day or 20 mg daily. Resume usual diet. No driving for 24 hours. Next colonoscopy in 2 years.   Upper Endoscopy, Adult, Care After This sheet gives you information about how to care for yourself after your procedure. Your health care provider may also give you more specific instructions. If you have problems or questions, contact your health care provider. What can I expect after the procedure? After the procedure, it is common to have:  A sore throat.  Mild stomach pain or discomfort.  Bloating.  Nausea. Follow these instructions at home:   Follow instructions from your health care provider about what to eat or drink after your procedure.  Return to your normal activities as told by your health care provider. Ask your health care provider what activities are safe for you.  Take over-the-counter and prescription medicines only as told by your health care provider.  Do not drive for 24 hours if you were given a sedative during your procedure.  Keep all follow-up visits as told by your health care provider. This is important. Contact a health care provider if you have:  A sore throat that lasts longer than one day.  Trouble swallowing. Get help right away if:  You vomit blood or your vomit looks like coffee grounds.  You have: ? A fever. ? Bloody, black, or tarry stools. ? A severe sore throat or you cannot swallow. ? Difficulty breathing. ? Severe pain in your chest or abdomen. Summary  After the procedure, it is common to have a sore throat, mild stomach discomfort, bloating, and nausea.  Do not drive for 24 hours if you were given a sedative during the procedure.  Follow instructions from your health care provider about what to eat or drink after your procedure.  Return to your normal activities as told by your health care provider. This information is not intended to replace advice given to you by  your health care provider. Make sure you discuss any questions you have with your health care provider. Document Revised: 04/20/2018 Document Reviewed: 03/29/2018 Elsevier Patient Education  [place].  Colonoscopy, Adult, Care After This sheet gives you information about how to care for yourself after your procedure. Your doctor may also give you more specific instructions. If you have problems or questions, call your doctor. What can I expect after the procedure? After the procedure, it is common to have:  A small amount of blood in your poop (stool) for 24 hours.  Some gas.  Mild cramping or bloating in your belly (abdomen). Follow these instructions at home: Eating and drinking   Drink enough fluid to keep your pee (urine) pale yellow.  Follow instructions from your doctor about what you cannot eat or drink.  Return to your normal diet as told by your doctor. Avoid heavy or fried foods that are hard to digest. Activity  Rest as told by your doctor.  Do not sit for a long time without moving. Get up to take short walks every 1-2 hours. This is important. Ask for help if you feel weak or unsteady.  Return to your normal activities as told by your doctor. Ask your doctor what activities are safe for you. To help cramping and bloating:   Try walking around.  Put heat on your belly as told by your doctor. Use the heat source that your doctor recommends, such as a moist heat pack or a heating pad. ?  Put a towel between your skin and the heat source. ? Leave the heat on for 20-30 minutes. ? Remove the heat if your skin turns bright red. This is very important if you are unable to feel pain, heat, or cold. You may have a greater risk of getting burned. General instructions  For the first 24 hours after the procedure: ? Do not drive or use machinery. ? Do not sign important documents. ? Do not drink alcohol. ? Do your daily activities more slowly than normal. ? Eat  foods that are soft and easy to digest.  Take over-the-counter or prescription medicines only as told by your doctor.  Keep all follow-up visits as told by your doctor. This is important. Contact a doctor if:  You have blood in your poop 2-3 days after the procedure. Get help right away if:  You have more than a small amount of blood in your poop.  You see large clumps of tissue (blood clots) in your poop.  Your belly is swollen.  You feel like you may vomit (nauseous).  You vomit.  You have a fever.  You have belly pain that gets worse, and medicine does not help your pain. Summary  After the procedure, it is common to have a small amount of blood in your poop. You may also have mild cramping and bloating in your belly.  For the first 24 hours after the procedure, do not drive or use machinery, do not sign important documents, and do not drink alcohol.  Get help right away if you have a lot of blood in your poop, feel like you may vomit, have a fever, or have more belly pain. This information is not intended to replace advice given to you by your health care provider. Make sure you discuss any questions you have with your health care provider. Document Revised: 05/23/2019 Document Reviewed: 05/23/2019 Elsevier Patient Education  [place].  Hemorrhoids Hemorrhoids are swollen veins that may develop:  In the butt (rectum). These are called internal hemorrhoids.  Around the opening of the butt (anus). These are called external hemorrhoids. Hemorrhoids can cause pain, itching, or bleeding. Most of the time, they do not cause serious problems. They usually get better with diet changes, lifestyle changes, and other home treatments. What are the causes? This condition may be caused by:  Having trouble pooping (constipation).  Pushing hard (straining) to poop.  Watery poop (diarrhea).  Pregnancy.  Being very overweight (obese).  Sitting for long periods of  time.  Heavy lifting or other activity that causes you to strain.  Anal sex.  Riding a bike for a long period of time. What are the signs or symptoms? Symptoms of this condition include:  Pain.  Itching or soreness in the butt.  Bleeding from the butt.  Leaking poop.  Swelling in the area.  One or more lumps around the opening of your butt. How is this diagnosed? A doctor can often diagnose this condition by looking at the affected area. The doctor may also:  Do an exam that involves feeling the area with a gloved hand (digital rectal exam).  Examine the area inside your butt using a small tube (anoscope).  Order blood tests. This may be done if you have lost a lot of blood.  Have you get a test that involves looking inside the colon using a flexible tube with a camera on the end (sigmoidoscopy or colonoscopy). How is this treated? This condition can usually be  treated at home. Your doctor may tell you to change what you eat, make lifestyle changes, or try home treatments. If these do not help, procedures can be done to remove the hemorrhoids or make them smaller. These may involve:  Placing rubber bands at the base of the hemorrhoids to cut off their blood supply.  Injecting medicine into the hemorrhoids to shrink them.  Shining a type of light energy onto the hemorrhoids to cause them to fall off.  Doing surgery to remove the hemorrhoids or cut off their blood supply. Follow these instructions at home: Eating and drinking   Eat foods that have a lot of fiber in them. These include whole grains, beans, nuts, fruits, and vegetables.  Ask your doctor about taking products that have added fiber (fibersupplements).  Reduce the amount of fat in your diet. You can do this by: ? Eating low-fat dairy products. ? Eating less red meat. ? Avoiding processed foods.  Drink enough fluid to keep your pee (urine) pale yellow. Managing pain and swelling   Take a warm-water  bath (sitz bath) for 20 minutes to ease pain. Do this 3-4 times a day. You may do this in a bathtub or using a portable sitz bath that fits over the toilet.  If told, put ice on the painful area. It may be helpful to use ice between your warm baths. ? Put ice in a plastic bag. ? Place a towel between your skin and the bag. ? Leave the ice on for 20 minutes, 2-3 times a day. General instructions  Take over-the-counter and prescription medicines only as told by your doctor. ? Medicated creams and medicines may be used as told.  Exercise often. Ask your doctor how much and what kind of exercise is best for you.  Go to the bathroom when you have the urge to poop. Do not wait.  Avoid pushing too hard when you poop.  Keep your butt dry and clean. Use wet toilet paper or moist towelettes after pooping.  Do not sit on the toilet for a long time.  Keep all follow-up visits as told by your doctor. This is important. Contact a doctor if you:  Have pain and swelling that do not get better with treatment or medicine.  Have trouble pooping.  Cannot poop.  Have pain or swelling outside the area of the hemorrhoids. Get help right away if you have:  Bleeding that will not stop. Summary  Hemorrhoids are swollen veins in the butt or around the opening of the butt.  They can cause pain, itching, or bleeding.  Eat foods that have a lot of fiber in them. These include whole grains, beans, nuts, fruits, and vegetables.  Take a warm-water bath (sitz bath) for 20 minutes to ease pain. Do this 3-4 times a day. This information is not intended to replace advice given to you by your health care provider. Make sure you discuss any questions you have with your health care provider. Document Revised: 11/04/2018 Document Reviewed: 03/18/2018 Elsevier Patient Education  [place].

## 2020-09-06 NOTE — Op Note (Signed)
[facility] Patient Name: Heather Gibbs Procedure Date: 09/06/2020 11:58 AM MRN: [id number] Date of Birth: 1971-06-18 Attending MD: Hildred Laser , MD CSN: [id number] Age: 49 Admit Type: Outpatient Procedure:                Colonoscopy Indications:              Lynch Syndrome Providers:                Hildred Laser, MD, Jeanann Lewandowsky. Sharon Seller, RN, Raphael Gibney, Technician Referring MD:             Stoney Bang, MD Medicines:                Midazolam 2 mg IV Complications:            No immediate complications. Estimated Blood Loss:     Estimated blood loss: none. Procedure:                Pre-Anesthesia Assessment:                           - Prior to the procedure, a History and Physical                            was performed, and patient medications and                            allergies were reviewed. The patient's tolerance of                            previous anesthesia was also reviewed. The risks                            and benefits of the procedure and the sedation                            options and risks were discussed with the patient.                            All questions were answered, and informed consent                            was obtained. Prior Anticoagulants: The patient has                            taken no previous anticoagulant or antiplatelet                            agents except for NSAID medication. ASA Grade                            Assessment: II - A patient with mild systemic  disease. After reviewing the risks and benefits,                            the patient was deemed in satisfactory condition to                            undergo the procedure.                           After obtaining informed consent, the colonoscope                            was passed under direct vision. Throughout the                            procedure, the patient's blood pressure, pulse,  and                            oxygen saturations were monitored continuously. The                            PCF-[id number] ([id number]) was introduced through the                            anus and advanced to the the cecum, identified by                            appendiceal orifice and ileocecal valve. The                            colonoscopy was performed without difficulty. The                            patient tolerated the procedure well. The quality                            of the bowel preparation was excellent. The                            ileocecal valve, appendiceal orifice, and rectum                            were photographed. Scope In: 12:00:55 PM Scope Out: 12:17:54 PM Scope Withdrawal Time: 0 hours 9 minutes 18 seconds  Total Procedure Duration: 0 hours 16 minutes 59 seconds  Findings:      The perianal and digital rectal examinations were normal.      The colon (entire examined portion) appeared normal.      External hemorrhoids were found during retroflexion. The hemorrhoids       were small. Impression:               - The entire examined colon is normal.                           - External hemorrhoids.                           -  No specimens collected. Moderate Sedation:      Moderate (conscious) sedation was administered by the endoscopy nurse       and supervised by the endoscopist. The following parameters were       monitored: oxygen saturation, heart rate, blood pressure, CO2       capnography and response to care. Total physician intraservice time was       21 minutes. Recommendation:           - Patient has a contact number available for                            emergencies. The signs and symptoms of potential                            delayed complications were discussed with the                            patient. Return to normal activities tomorrow.                            Written discharge instructions were provided to the                             patient.                           - Resume previous diet today.                           - Continue present medications.                           - Repeat colonoscopy in 2 years for screening                            purposes. Procedure Code(s):        --- Professional ---                           [phone], Colonoscopy, flexible; diagnostic, including                            collection of specimen(s) by brushing or washing,                            when performed (separate procedure)                           G0500, Moderate sedation services provided by the                            same physician or other qualified health care                            professional performing a gastrointestinal  endoscopic service that sedation supports,                            requiring the presence of an independent trained                            observer to assist in the monitoring of the                            patient's level of consciousness and physiological                            status; initial 15 minutes of intra-service time;                            patient age 19 years or older (additional time may                            be reported with [phone], as appropriate) Diagnosis Code(s):        --- Professional ---                           K64.4, Residual hemorrhoidal skin tags                           Z15.09, Genetic susceptibility to other malignant                            neoplasm CPT copyright 2019 American Medical Association. All rights reserved. The codes documented in this report are preliminary and upon coder review may  be revised to meet current compliance requirements. Hildred Laser, MD Hildred Laser, MD 09/06/2020 12:34:22 PM This report has been signed electronically. Number of Addenda: 0

## 2020-09-07 LAB — H. PYLORI ANTIBODY, IGG: H Pylori IgG: 0.18 Index Value (ref 0.00–0.79)

## 2020-09-11 ENCOUNTER — Encounter (HOSPITAL_COMMUNITY): Payer: Self-pay | Admitting: Internal Medicine

## 2020-12-11 DIAGNOSIS — S060XAA Concussion with loss of consciousness status unknown, initial encounter: Secondary | ICD-10-CM

## 2020-12-11 DIAGNOSIS — S060X9A Concussion with loss of consciousness of unspecified duration, initial encounter: Secondary | ICD-10-CM

## 2020-12-11 HISTORY — DX: Concussion with loss of consciousness of unspecified duration, initial encounter: S06.0X9A

## 2020-12-11 HISTORY — DX: Concussion with loss of consciousness status unknown, initial encounter: S06.0XAA

## 2020-12-28 ENCOUNTER — Other Ambulatory Visit: Payer: Self-pay | Admitting: Obstetrics and Gynecology

## 2020-12-28 DIAGNOSIS — N907 Vulvar cyst: Secondary | ICD-10-CM

## 2021-01-08 ENCOUNTER — Other Ambulatory Visit: Payer: Self-pay

## 2021-01-08 ENCOUNTER — Encounter: Payer: No Typology Code available for payment source | Attending: Obstetrics and Gynecology | Admitting: Nutrition

## 2021-01-08 DIAGNOSIS — F5081 Binge eating disorder: Secondary | ICD-10-CM | POA: Insufficient documentation

## 2021-01-08 DIAGNOSIS — E669 Obesity, unspecified: Secondary | ICD-10-CM | POA: Insufficient documentation

## 2021-01-08 NOTE — Progress Notes (Signed)
Medical Nutrition Therapy  Appointment Start time:  1600  Appointment End time:  1700  Primary concerns today: Obesity Referral diagnosis: e66.9 Preferred learning style:  Learning readiness:  change in progress   NUTRITION ASSESSMENT  Lap band in 2008. Vegetarian for 12 yrs. She admits she is a stress eater. Admits to eating a lot of processed foods. Wants to learn how to meal prep. She has belonged to [employer]. Has had breast cancer in 2016.  Anthropometrics  Wt Readings from Last 3 Encounters:  09/06/20 200 lb (90.7 kg)  07/12/20 204 lb 6.4 oz (92.7 kg)  05/10/20 203 lb 3.2 oz (92.2 kg)   Ht Readings from Last 3 Encounters:  09/06/20 5\' 3"  (1.6 m)  07/12/20 5' 2.5" (1.588 m)  05/10/20 5' 2.5" (1.588 m)   There is no height or weight on file to calculate BMI. @BMIFA @ Facility age limit for growth percentiles is 20 years. Facility age limit for growth percentiles is 20 years.    Clinical Medical Hx: breast cancer, anxiety, depression, restless leg syndrome Medications: see chart Labs:  CMP Latest Ref Rng & Units 06/23/2019 12/28/2018  Glucose 65 - 139 mg/dL 81 80  BUN 7 - 25 mg/dL 10 9  Creatinine 0.50 - 1.10 mg/dL 0.59 0.68  Sodium 135 - 146 mmol/L 141 141  Potassium 3.5 - 5.3 mmol/L 4.2 4.3  Chloride 98 - 110 mmol/L 104 101  CO2 20 - 32 mmol/L 30 23  Calcium 8.6 - 10.2 mg/dL 9.7 9.8  Total Protein 6.1 - 8.1 g/dL 6.8 6.8  Total Bilirubin 0.2 - 1.2 mg/dL 0.4 0.4  Alkaline Phos 39 - 117 IU/L - 71  AST 10 - 35 U/L 13 17  ALT 6 - 29 U/L 11 13   Lipid Panel   Notable Signs/Symptoms:   Lifestyle & Dietary Hx   Estimated daily fluid intake:  24 oz Supplements:  Sleep: varies. Not good Stress / self-care: emotional/family stress Current average weekly physical activity: adl  24-Hr Dietary Recall Eats 1-2 meals a day randomly. Admits to being a stress eater. Meals inconsistent. No schedule. Beverages: misc water, sodas, tea, juice  Estimated  Energy Needs Calories: 1200  Carbohydrate: 135g Protein: 90g Fat: 33g   NUTRITION DIAGNOSIS  NI-1.5 Excessive energy intake As related to disordered eating.  As evidenced by eating 1-2 meals per day and BMI > 30.   NUTRITION INTERVENTION  Nutrition education (E-1) on the following topics:  . Nutrition and weight loss education provided on My Plate, CHO counting, meal planning, portion sizes, timing of meals, avoiding snacks between meals aking medications as prescribed, benefits of exercising 60 minutes per day and prevention of DM and other health problems. .   Handouts Provided Include   The  Plate Method    Meal Plan card  Weight loss tips    Learning Style & Readiness for Change Teaching method utilized: Visual & Auditory  Demonstrated degree of understanding via: Teach Back  Barriers to learning/adherence to lifestyle change: none  Goals Established by Pt  Follow My Plate and eat meals on time.  Cut out all processed foods Focus on more plant based foods Choose high fiber foods Avoid sweets, sodas, junk food, Remove tempting foods from house Walk or exercise 60 minutes a day Drink 100 oz of water per day. Lose 1 lb per week Avoid snacks between meals.   MONITORING & EVALUATION Dietary intake, weekly physical activity, and weight in 1 month..  Next Steps  Patient  is to work on emotional eating.

## 2021-01-10 ENCOUNTER — Ambulatory Visit
Admission: RE | Admit: 2021-01-10 | Discharge: 2021-01-10 | Disposition: A | Discharge status: Home / Self Care | Payer: No Typology Code available for payment source | Source: Ambulatory Visit | Attending: Obstetrics and Gynecology | Admitting: Obstetrics and Gynecology

## 2021-01-10 DIAGNOSIS — N907 Vulvar cyst: Secondary | ICD-10-CM

## 2021-02-05 ENCOUNTER — Encounter: Payer: Self-pay | Admitting: Nutrition

## 2021-02-05 NOTE — Patient Instructions (Signed)
Goals Established by Pt  Follow My Plate and eat meals on time.  Cut out all processed foods Focus on more plant based foods Choose high fiber foods Avoid sweets, sodas, junk food, Remove tempting foods from house Walk or exercise 60 minutes a day Drink 100 oz of water per day. Lose 1 lb per week Avoid snacks between meals.

## 2021-02-19 ENCOUNTER — Other Ambulatory Visit: Payer: Self-pay

## 2021-02-19 ENCOUNTER — Encounter (HOSPITAL_BASED_OUTPATIENT_CLINIC_OR_DEPARTMENT_OTHER): Payer: Self-pay | Admitting: Obstetrics and Gynecology

## 2021-02-19 NOTE — Progress Notes (Signed)
Spoke w/ via phone for pre-op interview---pt Lab needs dos----  none            Lab results------none COVID test ------02-26-2021 1500 Arrive at -------1030 am 02-28-2021 NPO after MN NO Solid Food.  Clear liquids from MN until---930 am then npo Med rec completed Medications to take morning of surgery -----nexium Diabetic medication -----n/a Patient instructed to bring photo id and insurance card day of surgery Patient aware to have Driver (ride ) / caregiver spouse todd will stay    for 24 hours after surgery  Patient Special Instructions -----none Pre-Op special Istructions -----none Patient verbalized understanding of instructions that were given at this phone interview. Patient denies shortness of breath, chest pain, fever, cough at this phone interview.

## 2021-02-25 ENCOUNTER — Other Ambulatory Visit (HOSPITAL_COMMUNITY)
Admission: RE | Admit: 2021-02-25 | Discharge: 2021-02-25 | Disposition: A | Discharge status: Home / Self Care | Payer: No Typology Code available for payment source | Source: Ambulatory Visit | Attending: Obstetrics and Gynecology | Admitting: Obstetrics and Gynecology

## 2021-02-25 DIAGNOSIS — Z20822 Contact with and (suspected) exposure to covid-19: Secondary | ICD-10-CM | POA: Diagnosis not present

## 2021-02-25 DIAGNOSIS — Z01812 Encounter for preprocedural laboratory examination: Secondary | ICD-10-CM | POA: Insufficient documentation

## 2021-02-26 ENCOUNTER — Ambulatory Visit: Payer: No Typology Code available for payment source | Admitting: Nutrition

## 2021-02-26 ENCOUNTER — Other Ambulatory Visit (HOSPITAL_COMMUNITY): Payer: No Typology Code available for payment source

## 2021-02-26 LAB — SARS CORONAVIRUS 2 (TAT 6-24 HRS): SARS Coronavirus 2: NEGATIVE
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
## 2021-02-27 ENCOUNTER — Encounter: Payer: Self-pay | Admitting: Obstetrics and Gynecology

## 2021-02-27 ENCOUNTER — Other Ambulatory Visit: Payer: Self-pay

## 2021-02-27 ENCOUNTER — Ambulatory Visit (INDEPENDENT_AMBULATORY_CARE_PROVIDER_SITE_OTHER): Payer: No Typology Code available for payment source | Admitting: Obstetrics and Gynecology

## 2021-02-27 VITALS — BP 132/88 | HR 79 | Ht 63.0 in

## 2021-02-27 DIAGNOSIS — N952 Postmenopausal atrophic vaginitis: Secondary | ICD-10-CM | POA: Diagnosis not present

## 2021-02-27 DIAGNOSIS — N393 Stress incontinence (female) (male): Secondary | ICD-10-CM | POA: Diagnosis not present

## 2021-02-27 DIAGNOSIS — R350 Frequency of micturition: Secondary | ICD-10-CM

## 2021-02-27 DIAGNOSIS — N3281 Overactive bladder: Secondary | ICD-10-CM | POA: Diagnosis not present

## 2021-02-27 LAB — POCT URINALYSIS DIPSTICK
Appearance: NORMAL
Bilirubin, UA: NEGATIVE
Glucose, UA: NEGATIVE
Ketones, UA: NEGATIVE
Leukocytes, UA: NEGATIVE
Nitrite, UA: NEGATIVE
Protein, UA: NEGATIVE
Spec Grav, UA: 1.015 (ref 1.010–1.025)
Urobilinogen, UA: 0.2 E.U./dL
pH, UA: 6.5 (ref 5.0–8.0)

## 2021-02-27 MED ORDER — SOLIFENACIN SUCCINATE 5 MG PO TABS: 5.0000 mg | ORAL_TABLET | Freq: Every day | ORAL | 5 refills | Status: DC

## 2021-02-27 NOTE — Patient Instructions (Addendum)
Vulvovaginal moisturizer Options: Marland Kitchen Vitamin E oil (pump or capsule) or cream (Gene's Vit E Cream) . Coconut oil . Silicone-based lubricant for use during intercourse ("wet platinum" is a brand available at most drugstores) . Crisco Consider the ingredients of the product - the fewer the ingredients the better!  Directions for Use: Clean and dry your hands Gently dab the vulvar/vaginal area dry as needed Apply a "pea-sized" amount of the moisturizer onto your fingertip Using you other hand, open the labia  Apply the moisturizer to the vulvar/vaginal tissues Wear loose fitting underwear/clothing if possible following application Use moisturize up to 3 times daily as desired.  For treatment of stress urinary incontinence, which is leakage with physical activity/movement/strainging/coughing, we discussed expectant management versus nonsurgical options versus surgery. Nonsurgical options include weight loss, physical therapy, as well as a pessary.  Surgical options include a midurethral sling, which is a synthetic mesh sling that acts like a hammock under the urethra to prevent leakage of urine, and transurethral injection of a bulking agent.  We discussed the symptoms of overactive bladder (OAB), which include urinary urgency, urinary frequency, night-time urination, with or without urge incontinence.  We discussed management including behavioral therapy (decreasing bladder irritants by following a bladder diet, urge suppression strategies, timed voids, bladder retraining), physical therapy, medication; and for refractory cases posterior tibial nerve stimulation, sacral neuromodulation, and intravesical botulinum toxin injection.

## 2021-02-27 NOTE — H&P (Signed)
Heather Gibbs is an 50 y.o. female. with painful left labial mass x 4 months, desires removal  01/10/21 [place] of lesion showed: 0.8 x 0.5 x 0.5 cm indistinct, complex structure with internal echoes and increased blood flow.  No LMP recorded. Patient has had a hysterectomy.    Past Medical History:  Diagnosis Date  . Anxiety   . Arthritis    oa  . Complication of anesthesia    takes more to go under per pt  . Concussion 12/2020   no residual from  . Depression   . Family history of adverse reaction to anesthesia    grandmother and grandosn  slow to [employer]  . Family hx of colon cancer 11/19/2017  . GERD (gastroesophageal reflux disease)   . Lynch syndrome 11/19/2017  . PONV (postoperative nausea and vomiting)   . Restless leg   . Sleep apnea    mild osa no cpap needed  . Urinary incontinence   . Vulvar lesion   . Wears glasses     Past Surgical History:  Procedure Laterality Date  . ABDOMINAL HYSTERECTOMY  2006   complete  . BILATERAL OOPHORECTOMY  2017  . BREAST LUMPECTOMY Right 41 yrd sgo   benign  . CHOLECYSTECTOMY  2019   laproscopic  . COLONOSCOPY N/A 02/18/2018   Procedure: COLONOSCOPY;  Surgeon: Rogene Houston, MD;  [facility];  Service: Endoscopy;  Laterality: N/A;  8:30  . COLONOSCOPY N/A 08/11/2019   Procedure: COLONOSCOPY;  Surgeon: Rogene Houston, MD;  [facility];  Service: Endoscopy;  Laterality: N/A;  . COLONOSCOPY N/A 09/06/2020   Procedure: COLONOSCOPY;  Surgeon: Rogene Houston, MD;  [facility];  Service: Endoscopy;  Laterality: N/A;  200  . DILATION AND CURETTAGE OF UTERUS  25 yrs ago  . ESOPHAGOGASTRODUODENOSCOPY N/A 08/11/2019   Procedure: ESOPHAGOGASTRODUODENOSCOPY (EGD);  Surgeon: Rogene Houston, MD;  [facility];  Service: Endoscopy;  Laterality: N/A;  2:00  . ESOPHAGOGASTRODUODENOSCOPY N/A 09/06/2020   Procedure: ESOPHAGOGASTRODUODENOSCOPY (EGD);  Surgeon: Rogene Houston, MD;  [facility];  Service: Endoscopy;  Laterality: N/A;  . HIATAL HERNIA REPAIR  12-13- yrs ago  . LAPAROSCOPIC GASTRIC BANDING     2008  . PARTIAL HYSTERECTOMY    . POLYPECTOMY  08/11/2019   Procedure: POLYPECTOMY;  Surgeon: Rogene Houston, MD;  [facility];  Service: Endoscopy;;  gastric/  . TONSILLECTOMY  age 70    Family History  Problem Relation Age of Onset  . Hypertension Mother   . Diabetes Mother   . Lung cancer Mother   . Cancer - Lung Mother   . Brain cancer Sister   . Breast cancer Sister   . Breast cancer Maternal Grandmother   . Lung cancer Paternal Grandmother   . COPD Maternal Aunt   . Breast cancer Maternal Aunt   . Seizures Daughter        grand mal, as a young child  . Seizures Daughter        petite mal, as a young child   . Chiari malformation Daughter     Social History:  reports that she has never smoked. She has never used smokeless tobacco. She reports previous alcohol use. She reports that she does not use drugs.  Allergies:  Allergies  Allergen Reactions  . Adhesive [Tape] Other (See Comments)    Blistering if left on too long    No medications prior  to admission.    Review of Systems  No chest pain, shortness of breath, vaginal bleeding, abdominal pain.   Height 5\' 3"  (1.6 m), weight 90.7 kg. Physical Exam   From 12/26/20 visit Lymph Nodes *Palpation: non-tender supraclavicular nodes, non-tender axillary nodes, non-tender inguinal nodes  Cardiovascular *Auscultation: RRR, no murmur  Lungs *Respiratory Effort: no accessory muscle usage, no intercostal retractions *Auscultation: clear to auscultation, no wheezing, no rales/crackles, no rhonchi  Female Genitalia Vulva: no atrophy, no lesions Mons: normal Labia Majora: no erythema, no excoriation, no atrophy, no discoloration, no lesions, mass/ abscess (1-2 cm anterior left labial mass, deep, tender to palpation, round and mobile) Labia Minora: normal, no erythema, no  excoriation, no atrophy, no discoloration, no lesions Introitus: normal *Vagina: normal, no discharge, no blood present, no erythema, no atrophy, no lesions, no ulcers, no masses, no tenderness (vaginal cuff intact without abnormalities) *Cervix: absent *Adnexa/Parametria: no parametrial mass, ovary surgically absent bilateral  Results for orders placed or performed in visit on 02/27/21 (from the past 24 hour(s))  POCT Urinalysis Dipstick     Status: None   Collection Time: 02/27/21 11:54 AM  Result Value Ref Range   Color, UA yellow    Clarity, UA clear    Glucose, UA Negative Negative   Bilirubin, UA neg    Ketones, UA neg    Spec Grav, UA 1.015 1.010 - 1.025   Blood, UA trace    pH, UA 6.5 5.0 - 8.0   Protein, UA Negative Negative   Urobilinogen, UA 0.2 0.2 or 1.0 E.U./dL   Nitrite, UA neg    Leukocytes, UA Negative Negative   Appearance normal    Odor none     No results found.  Assessment/Plan: 49Y with painful left labial mass, plan for excision of vulvar lesion. No antibiotics indicated.   Rowland Lathe 02/27/2021, 3:34 PM

## 2021-02-28 ENCOUNTER — Ambulatory Visit (HOSPITAL_BASED_OUTPATIENT_CLINIC_OR_DEPARTMENT_OTHER)
Admission: RE | Admit: 2021-02-28 | Discharge: 2021-02-28 | Disposition: A | Discharge status: Home / Self Care | Payer: No Typology Code available for payment source | Attending: Obstetrics and Gynecology | Admitting: Obstetrics and Gynecology

## 2021-02-28 ENCOUNTER — Ambulatory Visit (HOSPITAL_BASED_OUTPATIENT_CLINIC_OR_DEPARTMENT_OTHER): Payer: No Typology Code available for payment source | Admitting: Certified Registered"

## 2021-02-28 ENCOUNTER — Encounter (HOSPITAL_BASED_OUTPATIENT_CLINIC_OR_DEPARTMENT_OTHER): Payer: Self-pay | Admitting: Obstetrics and Gynecology

## 2021-02-28 ENCOUNTER — Encounter (HOSPITAL_BASED_OUTPATIENT_CLINIC_OR_DEPARTMENT_OTHER)
Admission: RE | Disposition: A | Discharge status: Home / Self Care | Payer: Self-pay | Source: Home / Self Care | Attending: Obstetrics and Gynecology

## 2021-02-28 DIAGNOSIS — Z800 Family history of malignant neoplasm of digestive organs: Secondary | ICD-10-CM | POA: Insufficient documentation

## 2021-02-28 DIAGNOSIS — Z825 Family history of asthma and other chronic lower respiratory diseases: Secondary | ICD-10-CM | POA: Insufficient documentation

## 2021-02-28 DIAGNOSIS — Z888 Allergy status to other drugs, medicaments and biological substances status: Secondary | ICD-10-CM | POA: Diagnosis not present

## 2021-02-28 DIAGNOSIS — Z8249 Family history of ischemic heart disease and other diseases of the circulatory system: Secondary | ICD-10-CM | POA: Diagnosis not present

## 2021-02-28 DIAGNOSIS — D171 Benign lipomatous neoplasm of skin and subcutaneous tissue of trunk: Secondary | ICD-10-CM | POA: Diagnosis not present

## 2021-02-28 DIAGNOSIS — Z803 Family history of malignant neoplasm of breast: Secondary | ICD-10-CM | POA: Insufficient documentation

## 2021-02-28 DIAGNOSIS — Z820 Family history of epilepsy and other diseases of the nervous system: Secondary | ICD-10-CM | POA: Insufficient documentation

## 2021-02-28 DIAGNOSIS — Z801 Family history of malignant neoplasm of trachea, bronchus and lung: Secondary | ICD-10-CM | POA: Insufficient documentation

## 2021-02-28 DIAGNOSIS — Z833 Family history of diabetes mellitus: Secondary | ICD-10-CM | POA: Diagnosis not present

## 2021-02-28 DIAGNOSIS — N907 Vulvar cyst: Secondary | ICD-10-CM | POA: Diagnosis present

## 2021-02-28 HISTORY — DX: Family history of other specified conditions: Z84.89

## 2021-02-28 HISTORY — DX: Presence of spectacles and contact lenses: Z97.3

## 2021-02-28 HISTORY — PX: VULVAR LESION REMOVAL: SHX5391

## 2021-02-28 HISTORY — DX: Sleep apnea, unspecified: G47.30

## 2021-02-28 HISTORY — DX: Unspecified osteoarthritis, unspecified site: M19.90

## 2021-02-28 HISTORY — DX: Unspecified urinary incontinence: R32

## 2021-02-28 HISTORY — DX: Other specified noninflammatory disorders of vulva and perineum: N90.89

## 2021-02-28 SURGERY — VULVAR LESION
Anesthesia: General | Site: Vulva

## 2021-02-28 MED ORDER — FENTANYL CITRATE (PF) 100 MCG/2ML IJ SOLN
INTRAMUSCULAR | Status: AC
  Filled 2021-02-28: qty 2

## 2021-02-28 MED ORDER — KETOROLAC TROMETHAMINE 30 MG/ML IJ SOLN
INTRAMUSCULAR | Status: AC
  Filled 2021-02-28: qty 1

## 2021-02-28 MED ORDER — OXYCODONE HCL 5 MG PO TABS
ORAL_TABLET | ORAL | Status: AC
  Filled 2021-02-28: qty 1

## 2021-02-28 MED ORDER — GLYCOPYRROLATE PF 0.2 MG/ML IJ SOSY
PREFILLED_SYRINGE | INTRAMUSCULAR | Status: AC
  Filled 2021-02-28: qty 1

## 2021-02-28 MED ORDER — MIDAZOLAM HCL 2 MG/2ML IJ SOLN
INTRAMUSCULAR | Status: DC | PRN
  Administered 2021-02-28: 2 mg via INTRAVENOUS

## 2021-02-28 MED ORDER — FENTANYL CITRATE (PF) 100 MCG/2ML IJ SOLN
INTRAMUSCULAR | Status: DC | PRN
  Administered 2021-02-28: 50 ug via INTRAVENOUS
  Administered 2021-02-28 (×6): 25 ug via INTRAVENOUS

## 2021-02-28 MED ORDER — OXYCODONE HCL 5 MG/5ML PO SOLN: 5.0000 mg | Freq: Once | ORAL | Status: AC | PRN

## 2021-02-28 MED ORDER — ACETAMINOPHEN 500 MG PO TABS
1000.0000 mg | ORAL_TABLET | ORAL | Status: AC
  Administered 2021-02-28: 1000 mg via ORAL

## 2021-02-28 MED ORDER — FLUORESCEIN SODIUM 10 % IV SOLN
INTRAVENOUS | Status: AC
  Filled 2021-02-28: qty 5

## 2021-02-28 MED ORDER — LACTATED RINGERS IV SOLN: INTRAVENOUS | Status: DC

## 2021-02-28 MED ORDER — LIDOCAINE 2% (20 MG/ML) 5 ML SYRINGE
INTRAMUSCULAR | Status: AC
  Filled 2021-02-28: qty 5

## 2021-02-28 MED ORDER — WHITE PETROLATUM EX OINT
TOPICAL_OINTMENT | CUTANEOUS | Status: AC
  Filled 2021-02-28: qty 5

## 2021-02-28 MED ORDER — ONDANSETRON HCL 4 MG/2ML IJ SOLN
INTRAMUSCULAR | Status: AC
  Filled 2021-02-28: qty 2

## 2021-02-28 MED ORDER — LIDOCAINE-EPINEPHRINE (PF) 1 %-1:200000 IJ SOLN
INTRAMUSCULAR | Status: DC | PRN
  Administered 2021-02-28: 10 mL

## 2021-02-28 MED ORDER — GLYCOPYRROLATE PF 0.2 MG/ML IJ SOSY
PREFILLED_SYRINGE | INTRAMUSCULAR | Status: DC | PRN
  Administered 2021-02-28: .2 mg via INTRAVENOUS

## 2021-02-28 MED ORDER — IBUPROFEN 200 MG PO TABS: 600.0000 mg | ORAL_TABLET | Freq: Four times a day (QID) | ORAL | 0 refills | Status: DC | PRN

## 2021-02-28 MED ORDER — OXYCODONE HCL 5 MG PO TABS
5.0000 mg | ORAL_TABLET | Freq: Once | ORAL | Status: AC | PRN
  Administered 2021-02-28: 5 mg via ORAL

## 2021-02-28 MED ORDER — LIDOCAINE 2% (20 MG/ML) 5 ML SYRINGE
INTRAMUSCULAR | Status: DC | PRN
  Administered 2021-02-28: 50 mg via INTRAVENOUS

## 2021-02-28 MED ORDER — ONDANSETRON HCL 4 MG/2ML IJ SOLN
INTRAMUSCULAR | Status: DC | PRN
  Administered 2021-02-28: 4 mg via INTRAVENOUS

## 2021-02-28 MED ORDER — ACETAMINOPHEN 500 MG PO TABS
ORAL_TABLET | ORAL | Status: AC
  Filled 2021-02-28: qty 2

## 2021-02-28 MED ORDER — PROPOFOL 10 MG/ML IV BOLUS
INTRAVENOUS | Status: DC | PRN
  Administered 2021-02-28: 200 mg via INTRAVENOUS

## 2021-02-28 MED ORDER — MIDAZOLAM HCL 2 MG/2ML IJ SOLN
INTRAMUSCULAR | Status: AC
  Filled 2021-02-28: qty 2

## 2021-02-28 MED ORDER — KETOROLAC TROMETHAMINE 30 MG/ML IJ SOLN
INTRAMUSCULAR | Status: DC | PRN
  Administered 2021-02-28: 30 mg via INTRAVENOUS

## 2021-02-28 MED ORDER — FENTANYL CITRATE (PF) 100 MCG/2ML IJ SOLN: 25.0000 ug | INTRAMUSCULAR | Status: DC | PRN

## 2021-02-28 MED ORDER — DEXAMETHASONE SODIUM PHOSPHATE 10 MG/ML IJ SOLN
INTRAMUSCULAR | Status: DC | PRN
  Administered 2021-02-28 (×2): 5 mg via INTRAVENOUS

## 2021-02-28 MED ORDER — DEXAMETHASONE SODIUM PHOSPHATE 10 MG/ML IJ SOLN
INTRAMUSCULAR | Status: AC
  Filled 2021-02-28: qty 1

## 2021-02-28 SURGICAL SUPPLY — 22 items
BLADE CLIPPER SENSICLIP SURGIC (BLADE) ×2 IMPLANT
BLADE SURG 15 STRL LF DISP TIS (BLADE) ×1 IMPLANT
BLADE SURG 15 STRL SS (BLADE) ×2
CATH ROBINSON RED A/P 16FR (CATHETERS) ×2 IMPLANT
DECANTER SPIKE VIAL GLASS SM (MISCELLANEOUS) ×2 IMPLANT
DRSG TEGADERM 2-3/8X2-3/4 SM (GAUZE/BANDAGES/DRESSINGS) ×2 IMPLANT
GLOVE SURG ENC MOIS LTX SZ6 (GLOVE) ×2 IMPLANT
GLOVE SURG UNDER POLY LF SZ6.5 (GLOVE) ×2 IMPLANT
GLOVE SURG UNDER POLY LF SZ7 (GLOVE) ×6 IMPLANT
GOWN STRL REUS W/TWL LRG LVL3 (GOWN DISPOSABLE) ×6 IMPLANT
KIT TURNOVER CYSTO (KITS) ×2 IMPLANT
NEEDLE HYPO 22GX1.5 SAFETY (NEEDLE) ×2 IMPLANT
NS IRRIG 500ML POUR BTL (IV SOLUTION) ×2 IMPLANT
PACK VAGINAL WOMENS (CUSTOM PROCEDURE TRAY) ×2 IMPLANT
PAD OB MATERNITY 4.3X12.25 (PERSONAL CARE ITEMS) ×2 IMPLANT
PAD PREP 24X48 CUFFED NSTRL (MISCELLANEOUS) ×2 IMPLANT
SOL PREP POV-IOD 4OZ 10% (MISCELLANEOUS) ×2 IMPLANT
SPONGE GAUZE 2X2 8PLY STRL LF (GAUZE/BANDAGES/DRESSINGS) ×2 IMPLANT
SUT VIC AB 3-0 SH 27 (SUTURE) ×2
SUT VIC AB 3-0 SH 27XBRD (SUTURE) ×1 IMPLANT
SYR CONTROL 10ML LL (SYRINGE) ×2 IMPLANT
TOWEL OR 17X26 10 PK STRL BLUE (TOWEL DISPOSABLE) ×4 IMPLANT

## 2021-02-28 NOTE — Anesthesia Preprocedure Evaluation (Addendum)
Anesthesia Evaluation  Patient identified by MRN, date of birth, ID band Patient awake    Reviewed: Allergy & Precautions, NPO status   History of Anesthesia Complications (+) PONV  Airway Mallampati: II  TM Distance: >3 FB     Dental   Pulmonary sleep apnea ,    breath sounds clear to auscultation       Cardiovascular negative cardio ROS   Rhythm:Regular Rate:Normal     Neuro/Psych  Headaches, PSYCHIATRIC DISORDERS Anxiety Depression    GI/Hepatic Neg liver ROS, GERD  ,  Endo/Other  negative endocrine ROS  Renal/GU negative Renal ROS     Musculoskeletal   Abdominal   Peds  Hematology negative hematology ROS (+)   Anesthesia Other Findings   Reproductive/Obstetrics                            Anesthesia Physical Anesthesia Plan  ASA: III  Anesthesia Plan: General   Post-op Pain Management:    Induction: Intravenous  PONV Risk Score and Plan: 3 and Ondansetron, Dexamethasone and Midazolam  Airway Management Planned: LMA  Additional Equipment:   Intra-op Plan:   Post-operative Plan: Extubation in OR  Informed Consent: I have reviewed the patients History and Physical, chart, labs and discussed the procedure including the risks, benefits and alternatives for the proposed anesthesia with the patient or authorized representative who has indicated his/her understanding and acceptance.     Dental advisory given  Plan Discussed with: CRNA and Anesthesiologist  Anesthesia Plan Comments:         Anesthesia Quick Evaluation

## 2021-02-28 NOTE — Interval H&P Note (Signed)
History and Physical Interval Note:  02/28/2021 12:16 PM  Heather Gibbs  has presented today for surgery, with the diagnosis of vulvar cyst/lesion.  The various methods of treatment have been discussed with the patient and family. After consideration of risks, benefits and other options for treatment, the patient has consented to  Procedure(s): VULVAR LESION EXCISION (N/A) as a surgical intervention.  The patient's history has been reviewed, patient examined, no change in status, stable for surgery.  I have reviewed the patient's chart and labs.  Questions were answered to the patient's satisfaction.     Rowland Lathe

## 2021-02-28 NOTE — Brief Op Note (Signed)
02/28/2021  1:45 PM  PATIENT:  Heather Gibbs  50 y.o. female  PRE-OPERATIVE DIAGNOSIS:  Left vulvar cyst/lesion  POST-OPERATIVE DIAGNOSIS:  Left vulvar cyst/lesion   PROCEDURE:  Procedure(s): VULVAR LESION EXCISION (N/A)  SURGEON:  Surgeon(s) and Role:    * Rowland Lathe, MD - Primary  ANESTHESIA:   general  EBL:  85mL  BLOOD ADMINISTERED:none  DRAINS: none   LOCAL MEDICATIONS USED:  LIDOCAINE   SPECIMEN:  Source of Specimen:  vulvar lesion - suspect lipoma  DISPOSITION OF SPECIMEN:  PATHOLOGY  COUNTS:  YES  TOURNIQUET:  * No tourniquets in log *  DICTATION: .Note written in EPIC  PLAN OF CARE: Discharge to home after PACU  PATIENT DISPOSITION:  PACU - hemodynamically stable.   Delay start of Pharmacological VTE agent (>24hrs) due to surgical blood loss or risk of bleeding: not applicable

## 2021-02-28 NOTE — Discharge Instructions (Signed)
Vulva Biopsy, Care After This sheet gives you information about how to care for yourself after your procedure. Your health care provider may also give you more specific instructions. If you have problems or questions, contact your health care provider. What can I expect after the procedure? After the procedure, it is common to have:  Slight bleeding from the biopsy site.  Discomfort at the biopsy site. Follow these instructions at home: Biopsy site care  Follow instructions from your health care provider about how to take care of your biopsy site. Make sure you: ? Clean the area using water and mild soap twice a day or as told by your health care provider. Gently pat the area dry. ? If you were prescribed an antibiotic ointment, apply it as told by your health care provider. Do not stop using the antibiotic even if your condition improves. ? Take a warm water bath (sitz bath) as needed to help with pain and discomfort. A sitz bath is taken while you are sitting down. The water should only come up to your hips and should cover your buttocks. ? Leave stitches (sutures), skin glue, or adhesive strips in place. These skin closures may need to stay in place for 2 weeks or longer. If adhesive strip edges start to loosen and curl up, you may trim the loose edges. Do not remove adhesive strips completely unless your health care provider tells you to do that.  Check your biopsy site every day for signs of infection. Check for: ? More redness, swelling, or pain. ? More fluid or blood. ? Warmth. ? Pus or a bad smell.  Do not rub the biopsy area after urinating. Gently pat the area dry or use a bottle filled with warm water (peri-bottle) to clean the area. Gently wipe from front to back.   Lifestyle  Wear loose, cotton underwear. Do not wear tight pants.  Do not use a tampon, douche, or put anything inside your vagina for at least 1 week or until your health care provider approves.  Do not have sex  for at least 1 week or until your health care provider approves.  Do not exercise, such as running or biking, until your health care provider approves.  Do not swim or use a hot tub until your health care provider approves. You may shower or take a sitz bath. General instructions  Take over-the-counter and prescription medicines only as told by your health care provider.  Use a sanitary napkin until the bleeding stops.  Keep all follow-up visits as told by your health care provider. This is important. Contact a health care provider if:  You have more redness, swelling, or pain around your biopsy site.  You have more fluid or blood coming from your biopsy site.  Your biopsy site feels warm to the touch.  Your pain is not controlled with medicine. Get help right away if you have:  Heavy bleeding from the vulva.  Pus or a bad smell coming from your biopsy site.  A fever.  Lower abdominal pain. Summary  After the procedure, it is common to have slight bleeding and discomfort at the biopsy site.  Follow instructions from your health care provider after your biopsy. Make sure you clean the area with water and mild soap. Pat the area dry.  Take sitz baths as needed to help with pain and discomfort. Leave any sutures in place.  Check your biopsy site for signs of infection, which may include more redness, swelling, pain,  fluid, or blood, or feeling warm to the touch.  Get help right away if you have heavy bleeding, a fever, pus or a bad smell, or pain in the lower abdomen. This information is not intended to replace advice given to you by your health care provider. Make sure you discuss any questions you have with your health care provider. Document Revised: 04/29/2018 Document Reviewed: 04/29/2018 Elsevier Patient Education  2021 [place] Instructions  Activity: Get plenty of rest for the remainder of the day. A responsible individual must  stay with you for 24 hours following the procedure.  For the next 24 hours, DO NOT: -Drive a car -Paediatric nurse -Drink alcoholic beverages -Take any medication unless instructed by your physician -Make any legal decisions or sign important papers.  Meals: Start with liquid foods such as gelatin or soup. Progress to regular foods as tolerated. Avoid greasy, spicy, heavy foods. If nausea and/or vomiting occur, drink only clear liquids until the nausea and/or vomiting subsides. Call your physician if vomiting continues.  Special Instructions/Symptoms: Your throat may feel dry or sore from the anesthesia or the breathing tube placed in your throat during surgery. If this causes discomfort, gargle with warm salt water. The discomfort should disappear within 24 hours.

## 2021-02-28 NOTE — Anesthesia Procedure Notes (Signed)
Procedure Name: LMA Insertion Date/Time: 02/28/2021 12:42 PM Performed by: Suan Halter, CRNA Pre-anesthesia Checklist: Patient identified, Emergency Drugs available, Suction available and Patient being monitored Patient Re-evaluated:Patient Re-evaluated prior to induction Oxygen Delivery Method: Circle system utilized Preoxygenation: Pre-oxygenation with 100% oxygen Induction Type: IV induction Ventilation: Mask ventilation without difficulty LMA: LMA inserted LMA Size: 4.0 Number of attempts: 1 Airway Equipment and Method: Bite block Placement Confirmation: positive ETCO2 Tube secured with: Tape Dental Injury: Teeth and Oropharynx as per pre-operative assessment

## 2021-02-28 NOTE — Anesthesia Postprocedure Evaluation (Signed)
Anesthesia Post Note  Patient: Heather Gibbs  Procedure(s) Performed: VULVAR LESION EXCISION (N/A Vulva)     Patient location during evaluation: PACU Anesthesia Type: General Level of consciousness: awake Pain management: pain level controlled Vital Signs Assessment: post-procedure vital signs reviewed and stable Respiratory status: spontaneous breathing Cardiovascular status: stable Postop Assessment: no apparent nausea or vomiting Anesthetic complications: no   No complications documented.  Last Vitals:  Vitals:   02/28/21 1415 02/28/21 1445  BP: 110/69 127/83  Pulse: 75   Resp: 16 16  Temp: 36.4 C 36.4 C  SpO2: 98% 99%    Last Pain:  Vitals:   02/28/21 1458  TempSrc:   PainSc: 4                  Aspen Lawrance

## 2021-02-28 NOTE — Transfer of Care (Signed)
Immediate Anesthesia Transfer of Care Note  Patient: Heather Gibbs  Procedure(s) Performed: Procedure(s) (LRB): VULVAR LESION EXCISION (N/A)  Patient Location: PACU  Anesthesia Type: General  Level of Consciousness: awake, oriented and patient cooperative  Airway & Oxygen Therapy: Patient Spontanous Breathing on Room air  Post-op Assessment: Report given to PACU RN and Post -op Vital signs reviewed and stable  Post vital signs: Reviewed and stable  Complications: No apparent anesthesia complications  Last Vitals:  Vitals Value Taken Time  BP 110/69 02/28/21 1415  Temp 36.4 C 02/28/21 1415  Pulse 75 02/28/21 1415  Resp 16 02/28/21 1415  SpO2 98 % 02/28/21 1415  Vitals shown include unvalidated device data.  Last Pain:  Vitals:   02/28/21 1415  TempSrc:   PainSc: 3       Patients Stated Pain Goal: 4 (00/34/96 1164)  Complications: No complications documented.

## 2021-03-01 ENCOUNTER — Encounter (HOSPITAL_BASED_OUTPATIENT_CLINIC_OR_DEPARTMENT_OTHER): Payer: Self-pay | Admitting: Obstetrics and Gynecology

## 2021-03-01 LAB — SURGICAL PATHOLOGY

## 2021-03-01 NOTE — Op Note (Signed)
02/28/2021  PATIENT:  Heather Gibbs  50 y.o. female  PRE-OPERATIVE DIAGNOSIS:  Painful left vulvar cyst/lesion  POST-OPERATIVE DIAGNOSIS:  Left vulvar cyst/lesion  PROCEDURE:  Procedure(s): VULVAR LESION EXCISION (N/A)  SURGEON:  Surgeon(s) and Role:    * Rowland Lathe, MD - Primary  ANESTHESIA:   general  EBL:  6mL  BLOOD ADMINISTERED:none  DRAINS: none   LOCAL MEDICATIONS USED:  LIDOCAINE   SPECIMEN:  Source of Specimen:  vulvar biopsy - suspect lipoma  DISPOSITION OF SPECIMEN:  PATHOLOGY  COUNTS:  YES  PLAN OF CARE: Discharge to home after PACU  PATIENT DISPOSITION:  PACU - hemodynamically stable.  Findings: 1cm lipoma located in anterior left labia majora  Procedure details:  The patient was appropriately consented and taken to the operating room where anesthesia was obtained without difficulty. Thromboguards were applied and cycling. She was placed in the dorsal lithotomy position with legs in stirrups. She was prepped and draped in normal sterile fashion. Timeout was taken.   The patient was examined and left anterior labial mass was identified. An 1cm incision was made over the mass on the medial aspect of the labia with a scalpel. A hemostat was used to bluntly dissect to the level of the mass. The mass was mobile and difficult to hold in place, so decision was made to make a second 1cm incision on the lateral aspect of the labia. In the same manner, a hemostat was used to bluntly dissect to the level of the mass. A >1cm firm fatty appearing mass was identified, grasped with an Allis clamp, and dissected out with Metzenbaum scissors. The mass was removed and sent for pathology.   The deeper dermal layers were closed with interrupted stitches of 3-0 vicryl. The skin was closed with 3-0 vicryl in a subcuticular fashion. The incisions were infiltrated with 1% lidocaine with epinephrine for analgesia. A gauze and tegaderm dressing was applied.  Counts were correct. The patient was awakened from anesthesia. She tolerated the procedure well and was transferred to PACU in stable condition.

## 2021-03-11 ENCOUNTER — Telehealth (INDEPENDENT_AMBULATORY_CARE_PROVIDER_SITE_OTHER): Payer: Self-pay | Admitting: Internal Medicine

## 2021-03-11 NOTE — Telephone Encounter (Signed)
Patient called the office stated she is wanting to have her colonoscopy every year instead of every 2 years - states he brother has colon cancer and is very sick - yes now it is on both sides of her family

## 2021-03-12 ENCOUNTER — Ambulatory Visit: Payer: Self-pay | Admitting: Obstetrics and Gynecology

## 2021-03-16 NOTE — Telephone Encounter (Signed)
Patient carries gene for Lynch syndrome. Will do colonoscopy every year as she is not comfortable with exam every 2 years Please let patient know and schedule her colonoscopy in 1 year from her last exam.

## 2021-03-18 NOTE — Telephone Encounter (Signed)
I tried to reach her and did not get an answer, she will be due 09/06/21 of this year for a 1 year TCS

## 2021-05-28 ENCOUNTER — Ambulatory Visit (INDEPENDENT_AMBULATORY_CARE_PROVIDER_SITE_OTHER): Payer: 59 | Admitting: Obstetrics and Gynecology

## 2021-05-28 ENCOUNTER — Encounter: Payer: Self-pay | Admitting: Obstetrics and Gynecology

## 2021-05-28 ENCOUNTER — Other Ambulatory Visit: Payer: Self-pay

## 2021-05-28 VITALS — BP 115/82 | HR 73 | Ht 62.0 in

## 2021-05-28 DIAGNOSIS — N3281 Overactive bladder: Secondary | ICD-10-CM

## 2021-05-28 DIAGNOSIS — N393 Stress incontinence (female) (male): Secondary | ICD-10-CM | POA: Diagnosis not present

## 2021-05-28 MED ORDER — MIRABEGRON ER 25 MG PO TB24: 25.0000 mg | ORAL_TABLET | Freq: Every day | ORAL | 5 refills | Status: DC

## 2021-05-28 NOTE — Patient Instructions (Signed)
You were fitted with a ring with knob pessary. Come back in 6 weeks to see how it is working.  You can take it out every week, or sooner if you desire. Clean with liquid soap and water in between uses and leave out over night on the night that you remove it. Call for any problems.

## 2021-05-28 NOTE — Progress Notes (Signed)
[facility]   Subjective:     Chief Complaint: Pessary fitting  History of Present Illness: Heather Gibbs is a 50 y.o. female with stress incontinence and OAB who presents today for a pessary fitting.   Has been on vesicare for her OAB symptoms. Has not really noticed a difference with the medication, possible slight decreased frequency. However still soaking clothes several times per week.   Past Medical History: Patient  has a past medical history of Anxiety, Arthritis, Complication of anesthesia, Concussion (12/2020), Depression, Family history of adverse reaction to anesthesia, Family hx of colon cancer (11/19/2017), GERD (gastroesophageal reflux disease), Lynch syndrome (11/19/2017), PONV (postoperative nausea and vomiting), Restless leg, Sleep apnea, Urinary incontinence, Vulvar lesion, and Wears glasses.   Past Surgical History: She  has a past surgical history that includes Laparoscopic gastric banding; Bilateral oophorectomy (2017); Partial hysterectomy; Colonoscopy (N/A, 02/18/2018); Esophagogastroduodenoscopy (N/A, 08/11/2019); Colonoscopy (N/A, 08/11/2019); polypectomy (08/11/2019); Colonoscopy (N/A, 09/06/2020); Esophagogastroduodenoscopy (N/A, 09/06/2020); Hiatal hernia repair (12-13- yrs ago); Tonsillectomy (age 32); Breast lumpectomy (Right, 16 yrd sgo); Abdominal hysterectomy (2006); Dilation and curettage of uterus (25 yrs ago); Cholecystectomy (2019); and Vulvar lesion removal (N/A, 02/28/2021).   Medications: She has a current medication list which includes the following prescription(s): ascorbic acid, cetirizine, cholecalciferol, clonazepam, cyclobenzaprine, dicyclomine, escitalopram, esomeprazole, estradiol, ferrous sulfate, fluticasone, gabapentin, ibuprofen, ibuprofen, linaclotide, multivitamin with minerals, naltrexone, solifenacin, trazodone, and UNABLE TO FIND.   Allergies: Patient is allergic to adhesive [tape].   Social History: Patient  reports that  she has never smoked. She has never used smokeless tobacco. She reports previous alcohol use. She reports that she does not use drugs.      Objective:    BP 115/82   Pulse 73   Ht 5\' 2"  (1.575 m)   BMI 36.80 kg/m  Gen: No apparent distress, A&O x 3. Pelvic Exam: Normal external female genitalia; Bartholin's and Skene's glands normal in appearance; urethral meatus normal in appearance, no urethral masses or discharge.   A size 2-1/2in incontinence ring pessary was fitted. It was comfortable, stayed in place with valsalva and was an appropriate size on examination, with one finger fitting between the pessary and the vaginal walls. The patient demonstrated proper removal and replacement. Lot # [id number], Exp 08/28/25   POP-Q (02/27/21):   POP-Q   -2                                            Aa   -2                                           Ba   -7.5                                              C    3                                            Gh   3.5  Pb   8.5                                            tvl    -1                                            Ap   -1                                            Bp                                                  D     Assessment/Plan:    Assessment: [name] is a 50 y.o. with stress incontinence and OAB. Plan: SUI - She was fitted with a 2-1/2in incontinence ring pessary. She will remove at least weekly .   OAB - We discussed third line therapies as well as trying another medication for OAB.   For refractory OAB we reviewed the procedure for intravesical Botox injection with cystoscopy in the office and reviewed the risks, benefits and alternatives of treatment including but not limited to infection, need for self-catheterization and need for repeat therapy.  We discussed that there is a 5-15% chance of needing to catheterize with Botox and that this usually resolves in a few  months; however can persist for longer periods of time.  Typically Botox injections would need to be repeated every 3-12 months since this is not a permanent therapy.   We discussed the role of sacral neuromodulation and how it works. It requires a test phase, and documentation of bladder function via diary. After a successful test period, a permanent wire and generator are placed in the OR. The battery lasts 5 years on average and would need to be replaced surgically.  The goal of this therapy is at least a 50% improvement in symptoms. It is NOT realistic to expect a 100% cure.  We reviewed the fact that about 30% of patients fail the test phase and are not candidates for permanent generator placement.    We also discussed the role of percutaneous tibial nerve stimulation and how it works.  She understands it requires 12 weekly visits for temporary neuromodulation of the sacral nerve roots via the tibial nerve and that she may then require continued tapered treatment.   She decided to try another medication- Myrbetriq 25mg  ordered. In the meantime, she will consider the additional options.   Return 6 weeks for follow up    Jaquita Folds, MD  Time spent: I spent 25 minutes dedicated to the care of this patient on the date of this encounter to include pre-visit review of records, face-to-face time with the patient and post visit documentation and ordering medication/ testing. Additional time was spent for the pessary fitting.

## 2021-07-08 NOTE — Progress Notes (Deleted)
[facility]   Subjective:     Chief Complaint: No chief complaint on file.  History of Present Illness: Heather Gibbs is a 50 y.o. female with stress incontinence and OAB who presents for follow up. She is using a size 2.5in incontinence ring pessary for her SUI.  She was prescribed Myrbetriq at last visit  Past Medical History: Patient  has a past medical history of Anxiety, Arthritis, Complication of anesthesia, Concussion (12/2020), Depression, Family history of adverse reaction to anesthesia, Family hx of colon cancer (11/19/2017), GERD (gastroesophageal reflux disease), Lynch syndrome (11/19/2017), PONV (postoperative nausea and vomiting), Restless leg, Sleep apnea, Urinary incontinence, Vulvar lesion, and Wears glasses.   Past Surgical History: She  has a past surgical history that includes Laparoscopic gastric banding; Bilateral oophorectomy (2017); Partial hysterectomy; Colonoscopy (N/A, 02/18/2018); Esophagogastroduodenoscopy (N/A, 08/11/2019); Colonoscopy (N/A, 08/11/2019); polypectomy (08/11/2019); Colonoscopy (N/A, 09/06/2020); Esophagogastroduodenoscopy (N/A, 09/06/2020); Hiatal hernia repair (12-13- yrs ago); Tonsillectomy (age 20); Breast lumpectomy (Right, 16 yrd sgo); Abdominal hysterectomy (2006); Dilation and curettage of uterus (26 yrs ago); Cholecystectomy (2019); and Vulvar lesion removal (N/A, 02/28/2021).   Medications: She has a current medication list which includes the following prescription(s): ascorbic acid, cetirizine, cholecalciferol, clonazepam, cyclobenzaprine, dicyclomine, escitalopram, esomeprazole, estradiol, ferrous sulfate, fluticasone, gabapentin, ibuprofen, ibuprofen, linaclotide, mirabegron er, multivitamin with minerals, naltrexone, trazodone, and UNABLE TO FIND.   Allergies: Patient is allergic to adhesive [tape].   Social History: Patient  reports that she has never smoked. She has never used smokeless tobacco. She reports that she does  not currently use alcohol. She reports that she does not use drugs.      Objective:    Physical Exam: There were no vitals taken for this visit. Gen: No apparent distress, A&O x 3. Detailed Urogynecologic Evaluation:  Pelvic Exam: Normal external female genitalia; Bartholin's and Skene's glands normal in appearance; urethral meatus {urethra:24773}, no urethral masses or discharge. The pessary was noted to be {in place:24774}. It was removed and cleaned. Speculum exam revealed {vaginal lesions:24775} in the vagina. The pessary was replaced. It was comfortable to the patient and fit well.   No flowsheet data found.  Laboratory Results: Urine dipstick shows: {ua dip:315374::"negative for all components"}.    Assessment/Plan:    Assessment: [name] is a 50 y.o. with {PFD symptoms:24771} here for a pessary check. She is doing well.  Plan: She will {pessary plan:24776}. She will continue to use {lubricant:24777}. She will follow-up in *** {days/wks/mos/yrs:310907} for a pessary check or sooner as needed.  All questions were answered.   Time Spent:

## 2021-07-09 ENCOUNTER — Ambulatory Visit: Payer: 59 | Admitting: Obstetrics and Gynecology

## 2021-09-30 DIAGNOSIS — Z0289 Encounter for other administrative examinations: Secondary | ICD-10-CM

## 2021-10-29 ENCOUNTER — Encounter (INDEPENDENT_AMBULATORY_CARE_PROVIDER_SITE_OTHER): Payer: Self-pay

## 2021-10-31 ENCOUNTER — Encounter (INDEPENDENT_AMBULATORY_CARE_PROVIDER_SITE_OTHER): Payer: Self-pay

## 2021-11-12 ENCOUNTER — Other Ambulatory Visit: Payer: Self-pay

## 2021-11-12 ENCOUNTER — Ambulatory Visit (INDEPENDENT_AMBULATORY_CARE_PROVIDER_SITE_OTHER): Payer: 59 | Admitting: Family Medicine

## 2021-11-12 ENCOUNTER — Encounter (INDEPENDENT_AMBULATORY_CARE_PROVIDER_SITE_OTHER): Payer: Self-pay | Admitting: Family Medicine

## 2021-11-12 VITALS — BP 133/76 | HR 64 | Temp 98.2°F | Ht 63.0 in | Wt 199.0 lb

## 2021-11-12 DIAGNOSIS — R0602 Shortness of breath: Secondary | ICD-10-CM | POA: Diagnosis not present

## 2021-11-12 DIAGNOSIS — Z9189 Other specified personal risk factors, not elsewhere classified: Secondary | ICD-10-CM | POA: Diagnosis not present

## 2021-11-12 DIAGNOSIS — F32A Depression, unspecified: Secondary | ICD-10-CM

## 2021-11-12 DIAGNOSIS — E782 Mixed hyperlipidemia: Secondary | ICD-10-CM

## 2021-11-12 DIAGNOSIS — Z9884 Bariatric surgery status: Secondary | ICD-10-CM

## 2021-11-12 DIAGNOSIS — F419 Anxiety disorder, unspecified: Secondary | ICD-10-CM

## 2021-11-12 DIAGNOSIS — R5383 Other fatigue: Secondary | ICD-10-CM

## 2021-11-12 DIAGNOSIS — D508 Other iron deficiency anemias: Secondary | ICD-10-CM

## 2021-11-12 DIAGNOSIS — R739 Hyperglycemia, unspecified: Secondary | ICD-10-CM

## 2021-11-12 DIAGNOSIS — Z6835 Body mass index (BMI) 35.0-35.9, adult: Secondary | ICD-10-CM

## 2021-11-13 LAB — COMPREHENSIVE METABOLIC PANEL
ALT: 15 IU/L (ref 0–32)
AST: 21 IU/L (ref 0–40)
Albumin/Globulin Ratio: 2.2 (ref 1.2–2.2)
Albumin: 4.2 g/dL (ref 3.8–4.8)
Alkaline Phosphatase: 75 IU/L (ref 44–121)
BUN/Creatinine Ratio: 13 (ref 9–23)
BUN: 10 mg/dL (ref 6–24)
Bilirubin Total: 0.3 mg/dL (ref 0.0–1.2)
CO2: 26 mmol/L (ref 20–29)
Calcium: 9.5 mg/dL (ref 8.7–10.2)
Chloride: 101 mmol/L (ref 96–106)
Creatinine, Ser: 0.78 mg/dL (ref 0.57–1.00)
Globulin, Total: 1.9 g/dL (ref 1.5–4.5)
Glucose: 89 mg/dL (ref 70–99)
Potassium: 3.8 mmol/L (ref 3.5–5.2)
Sodium: 140 mmol/L (ref 134–144)
Total Protein: 6.1 g/dL (ref 6.0–8.5)
eGFR: 93 mL/min/{1.73_m2} (ref >59)

## 2021-11-13 LAB — CBC WITH DIFFERENTIAL/PLATELET
Basophils Absolute: 0 10*3/uL (ref 0.0–0.2)
Basos: 0 %
EOS (ABSOLUTE): 0 10*3/uL (ref 0.0–0.4)
Eos: 0 %
Hemoglobin: 13.6 g/dL (ref 11.1–15.9)
Immature Grans (Abs): 0 10*3/uL (ref 0.0–0.1)
Immature Granulocytes: 0 %
Lymphocytes Absolute: 2.1 10*3/uL (ref 0.7–3.1)
Lymphs: 32 %
MCH: 31.7 pg (ref 26.6–33.0)
MCHC: 33.5 g/dL (ref 31.5–35.7)
MCV: 95 fL (ref 79–97)
Monocytes Absolute: 0.5 10*3/uL (ref 0.1–0.9)
Monocytes: 7 %
Neutrophils Absolute: 4 10*3/uL (ref 1.4–7.0)
Neutrophils: 61 %
Platelets: 288 10*3/uL (ref 150–450)
RBC: 4.29 x10E6/uL (ref 3.77–5.28)
RDW: 12.1 % (ref 11.7–15.4)
WBC: 6.6 10*3/uL (ref 3.4–10.8)

## 2021-11-13 LAB — TSH: TSH: 0.819 u[IU]/mL (ref 0.450–4.500)

## 2021-11-13 LAB — LIPID PANEL WITH LDL/HDL RATIO
Cholesterol, Total: 197 mg/dL (ref 100–199)
HDL: 61 mg/dL (ref >39)
LDL Chol Calc (NIH): 121 mg/dL — ABNORMAL HIGH (ref 0–99)
LDL/HDL Ratio: 2 ratio (ref 0.0–3.2)
Triglycerides: 82 mg/dL (ref 0–149)
VLDL Cholesterol Cal: 15 mg/dL (ref 5–40)

## 2021-11-13 LAB — ANEMIA PANEL
Ferritin: 73 ng/mL (ref 15–150)
Folate, Hemolysate: 346 ng/mL
Folate, RBC: 852 ng/mL (ref >498)
Hematocrit: 40.6 % (ref 34.0–46.6)
Iron Saturation: 30 % (ref 15–55)
Iron: 83 ug/dL (ref 27–159)
Retic Ct Pct: 1.9 % (ref 0.6–2.6)
Total Iron Binding Capacity: 276 ug/dL (ref 250–450)
UIBC: 193 ug/dL (ref 131–425)
Vitamin B-12: 1856 pg/mL — ABNORMAL HIGH (ref 232–1245)

## 2021-11-13 LAB — T4, FREE: Free T4: 1.2 ng/dL (ref 0.82–1.77)

## 2021-11-13 LAB — HEMOGLOBIN A1C
Est. average glucose Bld gHb Est-mCnc: 111 mg/dL
Hgb A1c MFr Bld: 5.5 % (ref 4.8–5.6)

## 2021-11-13 LAB — T3: T3, Total: 81 ng/dL (ref 71–180)

## 2021-11-13 LAB — VITAMIN D 25 HYDROXY (VIT D DEFICIENCY, FRACTURES): Vit D, 25-Hydroxy: 20.5 ng/mL — ABNORMAL LOW (ref 30.0–100.0)

## 2021-11-13 LAB — PREALBUMIN: PREALBUMIN: 27 mg/dL (ref 12–34)

## 2021-11-13 LAB — INSULIN, RANDOM: INSULIN: 10.1 u[IU]/mL (ref 2.6–24.9)

## 2021-11-14 NOTE — Progress Notes (Signed)
Dear Dr. Brien Gibbs,   Thank you for referring Heather Gibbs Gibbs to our clinic. The following note includes my evaluation and treatment recommendations.  Chief Complaint:   Heather Gibbs Gibbs (MR# [id number]) is a 51 y.o. female who presents for evaluation and treatment of Heather Gibbs and related comorbidities. Current BMI is Body mass index is 35.25 kg/m. Heather Gibbs Gibbs has been struggling with her weight for many years and has been unsuccessful in either losing weight, maintaining weight loss, or reaching her healthy weight goal.  Heather Gibbs Gibbs is currently in the action stage of change and ready to dedicate time achieving and maintaining a healthier weight. Heather Gibbs Gibbs is interested in becoming our Heather Gibbs and working on intensive lifestyle modifications including (but not limited to) diet and exercise for weight loss.  Heather Gibbs Gibbs was referred by GYN. She has a history of lap band. Pt is lactose intolerance. She has strong family history of hypertension and diabetes mellitus. Pt has a bowl of oatmeal (2 cups) or cereal (2 cups) with almond milk or [place] butter biscuit (satisfied); Mid morning- trail mix (1 cup); Lunch- pinto or black beans, tomato, or 2-3 cups of pasta, unsweet tea or water (satisfied); Dinner- veggie burger or pasta/noodles and vegetables or salad. She is occasionally eating after supper. Pt does report some binge type behavior- multiple granola bars or sugary snacks (1-2 x a month).  Heather Gibbs Gibbs's habits were reviewed today and are as follows: Her family eats meals together, she thinks her family will eat healthier with her, her desired weight loss is 91, she has been heavy most of her life, she started gaining weight during her pregnancy with her first child in 68, her heaviest weight ever was 240 pounds, she has significant food cravings issues, she skips meals frequently, she is trying to follow a vegetarian diet, she is frequently drinking liquids with calories, she frequently makes  poor food choices, she has problems with excessive hunger, she frequently eats larger portions than normal, she has binge eating behaviors, and she struggles with emotional eating.  Depression Screen Heather Gibbs Gibbs's Food and Mood (modified PHQ-9) score was 16.  Depression screen PHQ 2/9 11/12/2021  Decreased Interest 2  Down, Depressed, Hopeless 2  PHQ - 2 Score 4  Altered sleeping 2  Tired, decreased energy 2  Change in appetite 2  Feeling bad or failure about yourself  2  Trouble concentrating 2  Moving slowly or fidgety/restless 2  Suicidal thoughts 0  PHQ-9 Score 16  Difficult doing work/chores Somewhat difficult   Subjective:   1. Other fatigue Heather Gibbs Gibbs admits to daytime somnolence and admits to waking up still tired. Patent has a history of symptoms of daytime fatigue, morning fatigue, and morning headache. Heather Gibbs generally gets 8 or 9 hours of sleep per night, and states that she has poor sleep quality. Snoring is present. Apneic episodes are not present. Epworth Sleepiness Score is 3. EKG normal sinus rhythm at 63 bpm.  2. SOB (shortness of breath) Heather Gibbs Gibbs notes increasing shortness of breath with exercising and seems to be worsening over time with weight gain. She notes getting out of breath sooner with activity than she used to. This has gotten worse recently. Heather Gibbs denies shortness of breath at rest or orthopnea.  3. Anxiety and depression Heather Gibbs Gibbs is on Lexapro 20 mg and doing well. She has prn Klonopin but uses it sparingly.  4. H/O bariatric surgery Pt has minimal restriction; 2-3 cc of fluid. She did go to a Psychologist, sport and exercise for evaluation for removal.  5. Other iron deficiency anemia Pt is on iron BID. Her last ferritin was low, but that was in 2021.  6. Mixed hyperlipidemia Heather Gibbs's last lipid panel showed slightly elevated LDL. She is not on statin therapy.  7. Hyperglycemia Pt has a history of elevated glucose levels. She has a strong family history of diabetes.  8. At  risk for deficient intake of food The Heather Gibbs is at a higher than average risk of deficient intake of food due to inadequate intake.  Assessment/Plan:   1. Other fatigue Heather Gibbs Gibbs does feel that her weight is causing her energy to be lower than it should be. Fatigue may be related to Heather Gibbs, depression or many other causes. Labs will be ordered, and in the meanwhile, Heather Gibbs Gibbs will focus on self care including making healthy food choices, increasing physical activity and focusing on stress reduction. Check labs today.  - EKG 12-Lead - T3 - T4, free - TSH - VITAMIN D 25 Hydroxy (Vit-D Deficiency, Fractures)  2. SOB (shortness of breath) Heather Gibbs Gibbs does feel that she gets out of breath more easily that she used to when she exercises. Heather Gibbs Gibbs's shortness of breath appears to be Heather Gibbs related and exercise induced. She has agreed to work on weight loss and gradually increase exercise to treat her exercise induced shortness of breath. Will continue to monitor closely.  3. Anxiety and depression Symptoms at goal. Behavior modification techniques were discussed today to help Heather Gibbs Gibbs deal with her anxiety.  Orders and follow up as documented in Heather Gibbs record. Follow up with PCP.  4. H/O bariatric surgery Follow up with plan going forward. Check labs today.  - Prealbumin  5. Other iron deficiency anemia Orders and follow up as documented in Heather Gibbs record. Check labs today.  Counseling Iron is essential for our bodies to make red blood cells.  Reasons that someone may be deficient include: an iron-deficient diet (more likely in those following vegan or vegetarian diets), women with heavy menses, patients with GI disorders or poor absorption, patients that have had bariatric surgery, frequent blood donors, patients with cancer, and patients with heart disease.   Iron-rich foods include dark leafy greens, red and white meats, eggs, seafood, and beans.   Certain foods and drinks prevent your body from  absorbing iron properly. Avoid eating these foods in the same meal as iron-rich foods or with iron supplements. These foods include: coffee, black tea, and red wine; milk, dairy products, and foods that are high in calcium; beans and soybeans; whole grains.  Constipation can be a side effect of iron supplementation. Increased water and fiber intake are helpful. Water goal: > 2 liters/day. Fiber goal: > 25 grams/day.  - CBC with Differential/Platelet - Anemia panel  6. Mixed hyperlipidemia Cardiovascular risk and specific lipid/LDL goals reviewed.  We discussed several lifestyle modifications today and Heather Gibbs Gibbs will continue to work on diet, exercise and weight loss efforts. Orders and follow up as documented in Heather Gibbs record.   Counseling Intensive lifestyle modifications are the first line treatment for this issue. Dietary changes: Increase soluble fiber. Decrease simple carbohydrates. Exercise changes: Moderate to vigorous-intensity aerobic activity 150 minutes per week if tolerated. Lipid-lowering medications: see documented in medical record. Check labs today.  - Lipid Panel With LDL/HDL Ratio  7. Hyperglycemia Fasting labs will be obtained and results with be discussed with Heather Gibbs Gibbs in 2 weeks at her follow up visit. In the meanwhile Heather Gibbs Gibbs was started on a lower simple carbohydrate diet and will work on weight loss efforts.  -  Comprehensive metabolic panel - Hemoglobin A1c - Insulin, random  8. At risk for deficient intake of food Heather Gibbs Gibbs was given approximately 15 minutes of deficit intake of food prevention counseling today. Heather Gibbs Gibbs is at risk for eating too few calories based on current food recall. She was encouraged to focus on meeting caloric and protein goals according to her recommended meal plan.    9. Heather Gibbs with current BMI of 35.3  Heather Gibbs Gibbs is currently in the action stage of change and her goal is to continue with weight loss efforts. I recommend Starsha begin the  structured treatment plan as follows:  She has agreed to the [place].  Exercise goals: No exercise has been prescribed at this time.   Behavioral modification strategies: increasing lean protein intake, meal planning and cooking strategies, emotional eating strategies, and planning for success.  She was informed of the importance of frequent follow-up visits to maximize her success with intensive lifestyle modifications for her multiple health conditions. She was informed we would discuss her lab results at her next visit unless there is a critical issue that needs to be addressed sooner. Laneice agreed to keep her next visit at the agreed upon time to discuss these results.  Objective:   Blood pressure 133/76, pulse 64, temperature 98.2 F (36.8 C), height 5\' 3"  (1.6 m), weight 199 lb (90.3 kg), SpO2 97 %. Body mass index is 35.25 kg/m.  EKG: Normal sinus rhythm, rate 63.  Indirect Calorimeter completed today shows a VO2 of 220 and a REE of 1512.  Her calculated basal metabolic rate is 0867 thus her basal metabolic rate is better than expected.  General: Cooperative, alert, well developed, in no acute distress. HEENT: Conjunctivae and lids unremarkable. Cardiovascular: Regular rhythm.  Lungs: Normal work of breathing. Neurologic: No focal deficits.   Lab Results  Component Value Date   CREATININE 0.78 11/12/2021   BUN 10 11/12/2021   NA 140 11/12/2021   K 3.8 11/12/2021   CL 101 11/12/2021   CO2 26 11/12/2021   Lab Results  Component Value Date   ALT 15 11/12/2021   AST 21 11/12/2021   ALKPHOS 75 11/12/2021   BILITOT 0.3 11/12/2021   Lab Results  Component Value Date   HGBA1C 5.5 11/12/2021   Lab Results  Component Value Date   INSULIN 10.1 11/12/2021   Lab Results  Component Value Date   TSH 0.819 11/12/2021   Lab Results  Component Value Date   CHOL 197 11/12/2021   HDL 61 11/12/2021   LDLCALC 121 (H) 11/12/2021   TRIG 82 11/12/2021   Lab  Results  Component Value Date   WBC 6.6 11/12/2021   HGB 13.6 11/12/2021   HCT 40.6 11/12/2021   MCV 95 11/12/2021   PLT 288 11/12/2021   Lab Results  Component Value Date   IRON 83 11/12/2021   TIBC 276 11/12/2021   FERRITIN 73 11/12/2021    Attestation Statements:   Reviewed by clinician on day of visit: allergies, medications, problem list, medical history, surgical history, family history, social history, and previous encounter notes.  Coral Ceo, CMA, am acting as transcriptionist for Coralie Common, MD.  This is the Heather Gibbs's first visit at [facility]. The Heather Gibbs's NEW Heather Gibbs PACKET was reviewed at length. Included in the packet: current and past health history, medications, allergies, ROS, gynecologic history (women only), surgical history, family history, social history, weight history, weight loss surgery history (for those that have had weight loss surgery),  nutritional evaluation, mood and food questionnaire, PHQ9, Epworth questionnaire, sleep habits questionnaire, Heather Gibbs life and health improvement goals questionnaire. These will all be scanned into the Heather Gibbs's chart under media.   During the visit, I independently reviewed the Heather Gibbs's EKG, bioimpedance scale results, and indirect calorimeter results. I used this information to tailor a meal plan for the Heather Gibbs that will help her to lose weight and will improve her Heather Gibbs-related conditions going forward. I performed a medically necessary appropriate examination and/or evaluation. I discussed the assessment and treatment plan with the Heather Gibbs. The Heather Gibbs was provided an opportunity to ask questions and all were answered. The Heather Gibbs agreed with the plan and demonstrated an understanding of the instructions. Labs were ordered at this visit and will be reviewed at the next visit unless more critical results need to be addressed immediately. Clinical information was updated and documented in the  EMR.   Time spent on visit including pre-visit chart review and post-visit care was 40 minutes  A separate 15 minutes was spent on risk counseling (see above).  I have reviewed the above documentation for accuracy and completeness, and I agree with the above. - Coralie Common, MD

## 2021-11-15 ENCOUNTER — Encounter (INDEPENDENT_AMBULATORY_CARE_PROVIDER_SITE_OTHER): Payer: Self-pay | Admitting: Family Medicine

## 2021-11-18 NOTE — Telephone Encounter (Signed)
Pt last seen by Dr. Ukleja.  

## 2021-11-26 ENCOUNTER — Ambulatory Visit (INDEPENDENT_AMBULATORY_CARE_PROVIDER_SITE_OTHER): Payer: BC Managed Care – PPO | Admitting: Family Medicine

## 2021-11-26 NOTE — Telephone Encounter (Signed)
Please advise 

## 2021-11-28 ENCOUNTER — Other Ambulatory Visit: Payer: Self-pay

## 2021-11-28 ENCOUNTER — Ambulatory Visit (INDEPENDENT_AMBULATORY_CARE_PROVIDER_SITE_OTHER): Payer: 59 | Admitting: Family Medicine

## 2021-11-28 ENCOUNTER — Encounter (INDEPENDENT_AMBULATORY_CARE_PROVIDER_SITE_OTHER): Payer: Self-pay | Admitting: Family Medicine

## 2021-11-28 VITALS — BP 96/65 | HR 75 | Temp 98.2°F | Ht 63.0 in | Wt 195.0 lb

## 2021-11-28 DIAGNOSIS — E8881 Metabolic syndrome: Secondary | ICD-10-CM | POA: Diagnosis not present

## 2021-11-28 DIAGNOSIS — E88819 Insulin resistance, unspecified: Secondary | ICD-10-CM

## 2021-11-28 DIAGNOSIS — E559 Vitamin D deficiency, unspecified: Secondary | ICD-10-CM

## 2021-11-28 DIAGNOSIS — E66812 Obesity, class 2: Secondary | ICD-10-CM

## 2021-11-28 DIAGNOSIS — Z6834 Body mass index (BMI) 34.0-34.9, adult: Secondary | ICD-10-CM

## 2021-11-28 DIAGNOSIS — E7849 Other hyperlipidemia: Secondary | ICD-10-CM

## 2021-11-28 DIAGNOSIS — E669 Obesity, unspecified: Secondary | ICD-10-CM | POA: Diagnosis not present

## 2021-11-28 DIAGNOSIS — Z9189 Other specified personal risk factors, not elsewhere classified: Secondary | ICD-10-CM

## 2021-11-28 MED ORDER — VITAMIN D (ERGOCALCIFEROL) 1.25 MG (50000 UNIT) PO CAPS: 50000.0000 [IU] | ORAL_CAPSULE | ORAL | 0 refills | Status: DC

## 2021-11-28 NOTE — Progress Notes (Signed)
Chief Complaint:   OBESITY Heather Gibbs is here to discuss her progress with her obesity treatment plan along with follow-up of her obesity related diagnoses. Heather Gibbs is on the [place] and states she is following her eating plan approximately 90% of the time. Heather Gibbs states she is not currently exercising.  Today's visit was #: 2 Starting weight: 199 lbs Starting date: 11/12/2021 Today's weight: 195 lbs Today's date: 11/28/2021 Total lbs lost to date: 4 Total lbs lost since last in-office visit: 4  Interim History: Pt has been sick over the last few weeks but has still tried to follow the plan as best as she could. She has missed some regular vegetables like potatoes and some beans as well. She is struggling with eggs and milk. Portion wise, she feels full. Pt is looking for other breakfast options. 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  Subjective:   1. Vitamin D deficiency New. Discussed labs with patient today. Pt is not on Rx Vit D. She has a Vit D level of 20.5 and reports fatigue.  2. Other hyperlipidemia Discussed labs with patient today. Pt has an LDL of 121, HDL 61, and triglycerides 82. She is not on meds. 10 year ASCVD risk of 0.6%.  The 10-year ASCVD risk score (Arnett DK, et al., 2019) is: 0.6%   Values used to calculate the score:     Age: 51 years     Sex: Female     Is Non-Hispanic African American: No     Diabetic: No     Tobacco smoker: No     Systolic Blood Pressure: 96 mmHg     Is BP treated: No     HDL Cholesterol: 61 mg/dL     Total Cholesterol: 197 mg/dL  3. Insulin resistance Discussed labs with patient today. Pt's last A1c was 5.5 with an insulin level of 10.1. She is not on meds. Pt has slightly increased carb intake due to being vegetarian.  4. At risk of diabetes mellitus Heather Gibbs is at higher than average risk for developing diabetes due to obesity.   Assessment/Plan:   1. Vitamin D deficiency Low Vitamin D level  contributes to fatigue and are associated with obesity, breast, and colon cancer. She agrees to start to take prescription Vitamin D 50,000 IU every week and will follow-up for routine testing of Vitamin D, at least 2-3 times per year to avoid over-replacement.  Start- Vitamin D, Ergocalciferol, (DRISDOL) 1.25 MG (50000 UNIT) CAPS capsule; Take 1 capsule (50,000 Units total) by mouth every 7 (seven) days.  Dispense: 4 capsule; Refill: 0  2. Other hyperlipidemia Cardiovascular risk and specific lipid/LDL goals reviewed.  We discussed several lifestyle modifications today and Heather Gibbs will continue to work on diet, exercise and weight loss efforts. Orders and follow up as documented in patient record. Repeat labs in 3 months.  Counseling Intensive lifestyle modifications are the first line treatment for this issue. Dietary changes: Increase soluble fiber. Decrease simple carbohydrates. Exercise changes: Moderate to vigorous-intensity aerobic activity 150 minutes per week if tolerated. Lipid-lowering medications: see documented in medical record.  3. Insulin resistance Heather Gibbs will continue to work on weight loss, exercise, and decreasing simple carbohydrates to help decrease the risk of diabetes. Heather Gibbs agreed to follow-up with [place] as directed to closely monitor her progress. Repeat labs in 3 months.  4. At risk of diabetes mellitus Heather Gibbs was given approximately 15 minutes of diabetes education and counseling today. We discussed intensive lifestyle modifications today  with an emphasis on weight loss as well as increasing exercise and decreasing simple carbohydrates in her diet. We also reviewed medication options with an emphasis on risk versus benefit of those discussed.   Repetitive spaced learning was employed today to elicit superior memory formation and behavioral change.  5. Obesity with current BMI of 34.7  Heather Gibbs is currently in the action stage of change. As such, her goal is to  continue with weight loss efforts. She has agreed to keeping a food journal and adhering to recommended goals of 1150-1300 calories and 80+ grams protein and the Vegetarian Plan.   Exercise goals: No exercise has been prescribed at this time.  Behavioral modification strategies: increasing lean protein intake, meal planning and cooking strategies, keeping healthy foods in the home, and planning for success.  Heather Gibbs has agreed to follow-up with our clinic in 2 weeks. She was informed of the importance of frequent follow-up visits to maximize her success with intensive lifestyle modifications for her multiple health conditions.   Objective:   Blood pressure 96/65, pulse 75, temperature 98.2 F (36.8 C), height 5\' 3"  (1.6 m), weight 195 lb (88.5 kg), SpO2 98 %. Body mass index is 34.54 kg/m.  General: Cooperative, alert, well developed, in no acute distress. HEENT: Conjunctivae and lids unremarkable. Cardiovascular: Regular rhythm.  Lungs: Normal work of breathing. Neurologic: No focal deficits.   Lab Results  Component Value Date   CREATININE 0.78 11/12/2021   BUN 10 11/12/2021   NA 140 11/12/2021   K 3.8 11/12/2021   CL 101 11/12/2021   CO2 26 11/12/2021   Lab Results  Component Value Date   ALT 15 11/12/2021   AST 21 11/12/2021   ALKPHOS 75 11/12/2021   BILITOT 0.3 11/12/2021   Lab Results  Component Value Date   HGBA1C 5.5 11/12/2021   Lab Results  Component Value Date   INSULIN 10.1 11/12/2021   Lab Results  Component Value Date   TSH 0.819 11/12/2021   Lab Results  Component Value Date   CHOL 197 11/12/2021   HDL 61 11/12/2021   LDLCALC 121 (H) 11/12/2021   TRIG 82 11/12/2021   Lab Results  Component Value Date   VD25OH 20.5 (L) 11/12/2021   Lab Results  Component Value Date   WBC 6.6 11/12/2021   HGB 13.6 11/12/2021   HCT 40.6 11/12/2021   MCV 95 11/12/2021   PLT 288 11/12/2021   Lab Results  Component Value Date   IRON 83 11/12/2021   TIBC  276 11/12/2021   FERRITIN 73 11/12/2021     Attestation Statements:   Reviewed by clinician on day of visit: allergies, medications, problem list, medical history, surgical history, family history, social history, and previous encounter notes.  Coral Ceo, CMA, am acting as transcriptionist for Coralie Common, MD.  I have reviewed the above documentation for accuracy and completeness, and I agree with the above. - Coralie Common, MD

## 2021-12-19 ENCOUNTER — Other Ambulatory Visit: Payer: Self-pay

## 2021-12-19 ENCOUNTER — Telehealth (INDEPENDENT_AMBULATORY_CARE_PROVIDER_SITE_OTHER): Payer: 59 | Admitting: Family Medicine

## 2022-01-13 ENCOUNTER — Encounter (INDEPENDENT_AMBULATORY_CARE_PROVIDER_SITE_OTHER): Payer: Self-pay | Admitting: Family Medicine

## 2022-01-13 ENCOUNTER — Ambulatory Visit (INDEPENDENT_AMBULATORY_CARE_PROVIDER_SITE_OTHER): Payer: 59 | Admitting: Family Medicine

## 2022-01-13 ENCOUNTER — Other Ambulatory Visit: Payer: Self-pay

## 2022-01-13 VITALS — BP 99/63 | HR 70 | Temp 98.2°F | Ht 63.0 in | Wt 193.0 lb

## 2022-01-13 DIAGNOSIS — Z9189 Other specified personal risk factors, not elsewhere classified: Secondary | ICD-10-CM

## 2022-01-13 DIAGNOSIS — E559 Vitamin D deficiency, unspecified: Secondary | ICD-10-CM

## 2022-01-13 DIAGNOSIS — F32A Depression, unspecified: Secondary | ICD-10-CM

## 2022-01-13 DIAGNOSIS — Z6835 Body mass index (BMI) 35.0-35.9, adult: Secondary | ICD-10-CM

## 2022-01-13 DIAGNOSIS — F419 Anxiety disorder, unspecified: Secondary | ICD-10-CM | POA: Diagnosis not present

## 2022-01-13 MED ORDER — BUPROPION HCL ER (SR) 100 MG PO TB12: 100.0000 mg | ORAL_TABLET | Freq: Every day | ORAL | 0 refills | Status: DC

## 2022-01-13 MED ORDER — VITAMIN D (ERGOCALCIFEROL) 1.25 MG (50000 UNIT) PO CAPS: 50000.0000 [IU] | ORAL_CAPSULE | ORAL | 0 refills | Status: DC

## 2022-01-13 NOTE — Progress Notes (Signed)
? ? ? ?Chief Complaint:  ? ?OBESITY ?Heather Gibbs is here to discuss her progress with her obesity treatment plan along with follow-up of her obesity related diagnoses. Heather Gibbs is on keeping a food journal and adhering to recommended goals of 1150-1300 calories and 80 plus grams of protein and states she is following her eating plan approximately 90% of the time. Heather Gibbs states she is walking for 30 minutes 2 times per week. ? ?Today's visit was #: 3 ?Starting weight: 199 lbs ?Starting date: 11/12/2021 ?Today's weight: 193 lbs ?Today's date: 01/13/2022 ?Total lbs lost to date: 6 lbs ?Total lbs lost since last in-office visit: 2 lbs ? ?Interim History: Heather Gibbs got sick and had to postpone her last appointment. Her depression is really significant currently. She hasn't been able to focus as much on meal plan as she had previously. She realizes how much her depression is making an impact on her weight loss.  ? ?Subjective:  ? ?1. Anxiety and depression ?Heather Gibbs is on currently on Lexapro 20 mg. She notes some anxiety but mostly depressive symptoms.  ? ?2. Vitamin D deficiency ?Heather Gibbs is on prescription Vitamin D. Her last Vitamin D level was 20.5 ? ?3. At risk for side effect of medication ?Heather Gibbs is at risk for side effect of medication due to start of Wellbutrin.  ? ?Assessment/Plan:  ? ?1. Anxiety and depression ?Heather Gibbs agrees to start Wellbutrin 100 mg by mouth daily. We will fill Wellbutrin SR 100 mg for 1 month with no refills. Behavior modification techniques were discussed today to help Nathasha deal with her emotional/non-hunger eating behaviors.  Orders and follow up as documented in patient record.  ? ?- buPROPion ER (WELLBUTRIN SR) 100 MG 12 hr tablet; Take 1 tablet (100 mg total) by mouth daily.  Dispense: 30 tablet; Refill: 0 ? ?2. Vitamin D deficiency ?Low Vitamin D level contributes to fatigue and are associated with obesity, breast, and colon cancer. We will refill prescription Vitamin D 50,000 IU every week  for 1 month with no refills and Heather Gibbs will follow-up for routine testing of Vitamin D, at least 2-3 times per year to avoid over-replacement. ? ?- Vitamin D, Ergocalciferol, (DRISDOL) 1.25 MG (50000 UNIT) CAPS capsule; Take 1 capsule (50,000 Units total) by mouth every 7 (seven) days.  Dispense: 4 capsule; Refill: 0 ? ?3.  At risk for side effect of medication ?Heather Gibbs was given approximately 15 minutes of drug side effect counseling today.  We discussed side effect possibility and risk versus benefits. Heather Gibbs agreed to the medication and will contact this office if these side effects are intolerable. ? ?Repetitive spaced learning was employed today to elicit superior memory formation and behavioral change.  ? ?4. Obesity, with current BMI of 34.2 ?Heather Gibbs is currently in the action stage of change. As such, her goal is to continue with weight loss efforts. She has agreed to practicing portion control and making smarter food choices, such as increasing vegetables and decreasing simple carbohydrates.  ? ?Exercise goals: All adults should avoid inactivity. Some physical activity is better than none, and adults who participate in any amount of physical activity gain some health benefits. ? ?Behavioral modification strategies: increasing lean protein intake, no skipping meals, and emotional eating strategies. ? ?Heather Gibbs has agreed to follow-up with our clinic in 2-3 weeks. She was informed of the importance of frequent follow-up visits to maximize her success with intensive lifestyle modifications for her multiple health conditions.  ? ?Objective:  ? ?Blood pressure 99/63, pulse 70, temperature 98.2 ?  F (36.8 ?C), height '5\' 3"'$  (1.6 m), weight 193 lb (87.5 kg), SpO2 98 %. ?Body mass index is 34.19 kg/m?. ? ?General: Cooperative, alert, well developed, in no acute distress. ?HEENT: Conjunctivae and lids unremarkable. ?Cardiovascular: Regular rhythm.  ?Lungs: Normal work of breathing. ?Neurologic: No focal deficits.   ? ?Lab Results  ?Component Value Date  ? CREATININE 0.78 11/12/2021  ? BUN 10 11/12/2021  ? NA 140 11/12/2021  ? K 3.8 11/12/2021  ? CL 101 11/12/2021  ? CO2 26 11/12/2021  ? ?Lab Results  ?Component Value Date  ? ALT 15 11/12/2021  ? AST 21 11/12/2021  ? ALKPHOS 75 11/12/2021  ? BILITOT 0.3 11/12/2021  ? ?Lab Results  ?Component Value Date  ? HGBA1C 5.5 11/12/2021  ? ?Lab Results  ?Component Value Date  ? INSULIN 10.1 11/12/2021  ? ?Lab Results  ?Component Value Date  ? TSH 0.819 11/12/2021  ? ?Lab Results  ?Component Value Date  ? CHOL 197 11/12/2021  ? HDL 61 11/12/2021  ? LDLCALC 121 (H) 11/12/2021  ? TRIG 82 11/12/2021  ? ?Lab Results  ?Component Value Date  ? VD25OH 20.5 (L) 11/12/2021  ? ?Lab Results  ?Component Value Date  ? WBC 6.6 11/12/2021  ? HGB 13.6 11/12/2021  ? HCT 40.6 11/12/2021  ? MCV 95 11/12/2021  ? PLT 288 11/12/2021  ? ?Lab Results  ?Component Value Date  ? IRON 83 11/12/2021  ? TIBC 276 11/12/2021  ? FERRITIN 73 11/12/2021  ? ?Attestation Statements:  ? ?Reviewed by clinician on day of visit: allergies, medications, problem list, medical history, surgical history, family history, social history, and previous encounter notes. ? ?I, Lizbeth Bark, RMA, am acting as transcriptionist for Coralie Common, MD ? ?I have reviewed the above documentation for accuracy and completeness, and I agree with the above. Coralie Common, MD ? ?

## 2022-02-04 ENCOUNTER — Telehealth (INDEPENDENT_AMBULATORY_CARE_PROVIDER_SITE_OTHER): Payer: 59 | Admitting: Family Medicine

## 2022-02-04 ENCOUNTER — Other Ambulatory Visit: Payer: Self-pay

## 2022-02-04 ENCOUNTER — Ambulatory Visit (INDEPENDENT_AMBULATORY_CARE_PROVIDER_SITE_OTHER): Payer: 59 | Admitting: Family Medicine

## 2022-02-04 ENCOUNTER — Encounter (INDEPENDENT_AMBULATORY_CARE_PROVIDER_SITE_OTHER): Payer: Self-pay | Admitting: Family Medicine

## 2022-02-04 ENCOUNTER — Encounter (INDEPENDENT_AMBULATORY_CARE_PROVIDER_SITE_OTHER): Payer: Self-pay

## 2022-02-04 DIAGNOSIS — E559 Vitamin D deficiency, unspecified: Secondary | ICD-10-CM | POA: Diagnosis not present

## 2022-02-04 DIAGNOSIS — E669 Obesity, unspecified: Secondary | ICD-10-CM | POA: Diagnosis not present

## 2022-02-04 DIAGNOSIS — F3289 Other specified depressive episodes: Secondary | ICD-10-CM | POA: Diagnosis not present

## 2022-02-04 DIAGNOSIS — Z6834 Body mass index (BMI) 34.0-34.9, adult: Secondary | ICD-10-CM

## 2022-02-04 DIAGNOSIS — E66812 Obesity, class 2: Secondary | ICD-10-CM

## 2022-02-04 MED ORDER — BUPROPION HCL ER (SR) 100 MG PO TB12: 100.0000 mg | ORAL_TABLET | Freq: Every day | ORAL | 0 refills | Status: DC

## 2022-02-04 MED ORDER — VITAMIN D (ERGOCALCIFEROL) 1.25 MG (50000 UNIT) PO CAPS: 50000.0000 [IU] | ORAL_CAPSULE | ORAL | 0 refills | Status: DC

## 2022-02-06 NOTE — Progress Notes (Signed)
? ? ?TeleHealth Visit:  ?Due to the COVID-19 pandemic, this visit was completed with telemedicine (audio/video) technology to reduce patient and provider exposure as well as to preserve personal protective equipment.  ? ?[name] has verbally consented to this TeleHealth visit. The patient is located at home, the provider is located at the [facility] office. The participants in this visit include the listed provider and patient. The visit was conducted today via Mychart video. ? ?Chief Complaint: OBESITY ?Heather Gibbs is here to discuss her progress with her obesity treatment plan along with follow-up of her obesity related diagnoses. [name] is on practicing portion control and making smarter food choices, such as increasing vegetables and decreasing simple carbohydrates and states she is following her eating plan approximately 90 to 95% of the time. Heather Gibbs states she has not been exercising, but has been doing regular activities. ? ?Today's visit was #: 4 ?Starting weight: 199 lbs ?Starting date: 11/12/2021 ? ?Interim History: Heather Gibbs tested positive for influenza and has been taking Tamiflu BID. Her daughter had her baby at 26 weeks and was 1100 grams. Her name is Heather Gibbs. Heather Gibbs thinks that she has hit the 20lb mark. She had stuck almost completely to the plan prior to getting sick. Heather Gibbs's mood is improved and she does not have any upcoming plans. ? ?Subjective:  ? ?1. Vitamin D deficiency ?She is currently taking prescription vitamin D 50,000 IU each week. She admits fatigue and denies nausea, vomiting or muscle weakness. ? ?Lab Results  ?Component Value Date  ? VD25OH 20.5 (L) 11/12/2021  ? ?2. Other depression ?[name] was started on Wellbutrin '100mg'$  and Lexapro '20mg'$ . Her symptoms are improved. Bayle denies suicidal or homicidal ideations. ? ?Assessment/Plan:  ? ?1. Vitamin D deficiency ?Low Vitamin D level contributes to fatigue and are associated with obesity, breast, and colon cancer.  She agrees to continue to take prescription Vitamin D '@50'$ ,000 IU every week and will follow-up for routine testing of Vitamin D, at least 2-3 times per year to avoid over-replacement. ? ?- Vitamin D, Ergocalciferol, (DRISDOL) 1.25 MG (50000 UNIT) CAPS capsule; Take 1 capsule (50,000 Units total) by mouth every 7 (seven) days.  Dispense: 4 capsule; Refill: 0 ? ?2. Other depression ?Behavior modification techniques were discussed today to help Heather Gibbs deal with her emotional/non-hunger eating behaviors.  Orders and follow up as documented in patient record.  ? ?- buPROPion ER (WELLBUTRIN SR) 100 MG 12 hr tablet; Take 1 tablet (100 mg total) by mouth daily.  Dispense: 30 tablet; Refill: 0 ? ?3. Obesity, with current BMI of 34.2 ?Heather Gibbs is currently in the action stage of change. As such, her goal is to continue with weight loss efforts. She has agreed to the [place].  ? ?Exercise goals: All adults should avoid inactivity. Some physical activity is better than none, and adults who participate in any amount of physical activity gain some health benefits. ? ?Behavioral modification strategies: increasing lean protein intake, meal planning and cooking strategies, and keeping healthy foods in the home. ? ?[name] has agreed to follow-up with our clinic in 4 weeks. She was informed of the importance of frequent follow-up visits to maximize her success with intensive lifestyle modifications for her multiple health conditions. ? ?Objective:  ? ?VITALS: Per patient if applicable, see vitals. ?GENERAL: Alert and in no acute distress. ?CARDIOPULMONARY: No increased WOB. Speaking in clear sentences.  ?PSYCH: Pleasant and cooperative. Speech normal rate and rhythm. Affect is appropriate. Insight and judgement are appropriate. Attention is  focused, linear, and appropriate.  ?NEURO: Oriented as arrived to appointment on time with no prompting.  ? ?Lab Results  ?Component Value Date  ? CREATININE 0.78 11/12/2021  ? BUN 10  11/12/2021  ? NA 140 11/12/2021  ? K 3.8 11/12/2021  ? CL 101 11/12/2021  ? CO2 26 11/12/2021  ? ?Lab Results  ?Component Value Date  ? ALT 15 11/12/2021  ? AST 21 11/12/2021  ? ALKPHOS 75 11/12/2021  ? BILITOT 0.3 11/12/2021  ? ?Lab Results  ?Component Value Date  ? HGBA1C 5.5 11/12/2021  ? ?Lab Results  ?Component Value Date  ? INSULIN 10.1 11/12/2021  ? ?Lab Results  ?Component Value Date  ? TSH 0.819 11/12/2021  ? ?Lab Results  ?Component Value Date  ? CHOL 197 11/12/2021  ? HDL 61 11/12/2021  ? LDLCALC 121 (H) 11/12/2021  ? TRIG 82 11/12/2021  ? ?Lab Results  ?Component Value Date  ? VD25OH 20.5 (L) 11/12/2021  ? ?Lab Results  ?Component Value Date  ? WBC 6.6 11/12/2021  ? HGB 13.6 11/12/2021  ? HCT 40.6 11/12/2021  ? MCV 95 11/12/2021  ? PLT 288 11/12/2021  ? ?Lab Results  ?Component Value Date  ? IRON 83 11/12/2021  ? TIBC 276 11/12/2021  ? FERRITIN 73 11/12/2021  ? ? ?Attestation Statements:  ? ?Reviewed by clinician on day of visit: allergies, medications, problem list, medical history, surgical history, family history, social history, and previous encounter notes. ? ?I, Marcille Blanco, CMA, am acting as transcriptionist for Coralie Common, MD ? ?I have reviewed the above documentation for accuracy and completeness, and I agree with the above. Coralie Common, MD ? ?

## 2022-03-03 ENCOUNTER — Encounter (INDEPENDENT_AMBULATORY_CARE_PROVIDER_SITE_OTHER): Payer: Self-pay | Admitting: Family Medicine

## 2022-03-03 ENCOUNTER — Ambulatory Visit (INDEPENDENT_AMBULATORY_CARE_PROVIDER_SITE_OTHER): Payer: 59 | Admitting: Family Medicine

## 2022-03-03 VITALS — BP 90/58 | HR 63 | Temp 97.8°F | Ht 63.0 in | Wt 185.0 lb

## 2022-03-03 DIAGNOSIS — Z6832 Body mass index (BMI) 32.0-32.9, adult: Secondary | ICD-10-CM

## 2022-03-03 DIAGNOSIS — F3289 Other specified depressive episodes: Secondary | ICD-10-CM

## 2022-03-03 DIAGNOSIS — E559 Vitamin D deficiency, unspecified: Secondary | ICD-10-CM | POA: Diagnosis not present

## 2022-03-03 DIAGNOSIS — L304 Erythema intertrigo: Secondary | ICD-10-CM

## 2022-03-03 DIAGNOSIS — Z9189 Other specified personal risk factors, not elsewhere classified: Secondary | ICD-10-CM

## 2022-03-03 DIAGNOSIS — E669 Obesity, unspecified: Secondary | ICD-10-CM

## 2022-03-03 MED ORDER — BUPROPION HCL ER (SR) 100 MG PO TB12: 100.0000 mg | ORAL_TABLET | Freq: Every day | ORAL | 0 refills | Status: DC

## 2022-03-03 MED ORDER — VITAMIN D (ERGOCALCIFEROL) 1.25 MG (50000 UNIT) PO CAPS: 50000.0000 [IU] | ORAL_CAPSULE | ORAL | 0 refills | Status: DC
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
## 2022-03-26 ENCOUNTER — Encounter (INDEPENDENT_AMBULATORY_CARE_PROVIDER_SITE_OTHER): Payer: Self-pay | Admitting: Adult Health

## 2022-03-26 ENCOUNTER — Ambulatory Visit (INDEPENDENT_AMBULATORY_CARE_PROVIDER_SITE_OTHER): Payer: 59 | Admitting: Adult Health

## 2022-03-26 VITALS — BP 95/64 | HR 63 | Temp 98.0°F | Ht 63.0 in | Wt 185.0 lb

## 2022-03-26 DIAGNOSIS — F3289 Other specified depressive episodes: Secondary | ICD-10-CM

## 2022-03-26 DIAGNOSIS — Z6832 Body mass index (BMI) 32.0-32.9, adult: Secondary | ICD-10-CM | POA: Diagnosis not present

## 2022-03-26 DIAGNOSIS — Z9189 Other specified personal risk factors, not elsewhere classified: Secondary | ICD-10-CM

## 2022-03-26 DIAGNOSIS — E559 Vitamin D deficiency, unspecified: Secondary | ICD-10-CM | POA: Diagnosis not present

## 2022-03-26 DIAGNOSIS — E669 Obesity, unspecified: Secondary | ICD-10-CM

## 2022-03-26 MED ORDER — VITAMIN D (ERGOCALCIFEROL) 1.25 MG (50000 UNIT) PO CAPS: 50000.0000 [IU] | ORAL_CAPSULE | ORAL | 0 refills | Status: DC

## 2022-03-27 ENCOUNTER — Ambulatory Visit (INDEPENDENT_AMBULATORY_CARE_PROVIDER_SITE_OTHER): Payer: 59 | Admitting: Family Medicine

## 2022-03-27 NOTE — Progress Notes (Signed)
Chief Complaint:   OBESITY Heather Gibbs is here to discuss her progress with her obesity treatment plan along with follow-up of her obesity related diagnoses. Heather Gibbs is on the [place] and states she is following her eating plan approximately 50% of the time. Heather Gibbs states she is walking for 60 minutes 3 times per week.  Today's visit was #: 6 Starting weight: 199 lbs Starting date: 11/12/2021 Today's weight: 185 lbs Today's date: 03/26/22 Total lbs lost to date: 14 lbs Total lbs lost since last in-office visit: 0  Interim History:  She endorses increased polyphagia since last office visit. She had to travel to [place] twice for work recently. 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 Keeping grandchildren more frequently.  Subjective:   1. Vitamin D deficiency 11/12/2021 vitamin D level-20.5. She is on ergocalciferol- denies mass in neck, dysphagia, dyspepsia, persistent hoarseness, abd pain, or N/V/Constipation.  2. Other depression BP/heart rate stable.   Epic review demonstrates her systolic blood pressure is 96-789, diastolic blood pressure is 60-80. She is not on antihypertensive medication.   She denies suicidal/homicidal ideation. She is on bupropion SR 100 mg once daily.  3. At risk for deficient intake of food Heather Gibbs is at risk for deficient food intake due to eating on plan 50% of time.   Assessment/Plan:   1. Vitamin D deficiency Refill: - Vitamin D, Ergocalciferol, (DRISDOL) 1.25 MG (50000 UNIT) CAPS capsule; Take 1 capsule (50,000 Units total) by mouth every 7 (seven) days.  Dispense: 4 capsule; Refill: 0  2. Other depression Continue bupropion SR 100 mg once daily.  3. At risk for deficient intake of food Heather Gibbs was given approximately 15 minutes of deficient intake of food prevention counseling today. Heather Gibbs is at risk for eating too few calories based on current food recall. She was encouraged  to focus on meeting caloric and protein goals according to her recommended meal plan.   4. Obesity, with current BMI of 32.8 Heather Gibbs is currently in the action stage of change. As such, her goal is to continue with weight loss efforts. She has agreed to the [place].   Increase plan compliance from 50% to 80%.  Exercise goals: as is.  Behavioral modification strategies: increasing lean protein intake, decreasing simple carbohydrates, meal planning and cooking strategies, keeping healthy foods in the home, and planning for success.  Heather Gibbs has agreed to follow-up with our clinic in 3-4 weeks. She was informed of the importance of frequent follow-up visits to maximize her success with intensive lifestyle modifications for her multiple health conditions.   Objective:   Blood pressure 95/64, pulse 63, temperature 98 F (36.7 C), height '5\' 3"'$  (1.6 m), weight 185 lb (83.9 kg), SpO2 98 %. Body mass index is 32.77 kg/m.  General: Cooperative, alert, well developed, in no acute distress. HEENT: Conjunctivae and lids unremarkable. Cardiovascular: Regular rhythm.  Lungs: Normal work of breathing. Neurologic: No focal deficits.   Lab Results  Component Value Date   CREATININE 0.78 11/12/2021   BUN 10 11/12/2021   NA 140 11/12/2021   K 3.8 11/12/2021   CL 101 11/12/2021   CO2 26 11/12/2021   Lab Results  Component Value Date   ALT 15 11/12/2021   AST 21 11/12/2021   ALKPHOS 75 11/12/2021   BILITOT 0.3 11/12/2021   Lab Results  Component Value Date   HGBA1C 5.5 11/12/2021   Lab Results  Component Value Date   INSULIN 10.1 11/12/2021  Lab Results  Component Value Date   TSH 0.819 11/12/2021   Lab Results  Component Value Date   CHOL 197 11/12/2021   HDL 61 11/12/2021   LDLCALC 121 (H) 11/12/2021   TRIG 82 11/12/2021   Lab Results  Component Value Date   VD25OH 20.5 (L) 11/12/2021   Lab Results  Component Value Date   WBC 6.6 11/12/2021   HGB 13.6  11/12/2021   HCT 40.6 11/12/2021   MCV 95 11/12/2021   PLT 288 11/12/2021   Lab Results  Component Value Date   IRON 83 11/12/2021   TIBC 276 11/12/2021   FERRITIN 73 11/12/2021    Attestation Statements:   Reviewed by clinician on day of visit: allergies, medications, problem list, medical history, surgical history, family history, social history, and previous encounter notes.   I, Heather Fick, FNP, am acting as Location manager for Heather Marble, NP.  I have reviewed the above documentation for accuracy and completeness, and I agree with the above. -  Heather Gibbs d. Deklyn Gibbon, NP-C

## 2022-03-28 DIAGNOSIS — E559 Vitamin D deficiency, unspecified: Secondary | ICD-10-CM | POA: Insufficient documentation

## 2022-03-31 ENCOUNTER — Encounter (INDEPENDENT_AMBULATORY_CARE_PROVIDER_SITE_OTHER): Payer: Self-pay | Admitting: Family Medicine

## 2022-04-01 NOTE — Telephone Encounter (Signed)
Please advise 

## 2022-04-22 ENCOUNTER — Encounter (INDEPENDENT_AMBULATORY_CARE_PROVIDER_SITE_OTHER): Payer: Self-pay | Admitting: Nurse Practitioner

## 2022-04-22 ENCOUNTER — Ambulatory Visit (INDEPENDENT_AMBULATORY_CARE_PROVIDER_SITE_OTHER): Payer: 59 | Admitting: Nurse Practitioner

## 2022-04-22 VITALS — BP 99/65 | HR 68 | Temp 97.8°F | Ht 63.0 in | Wt 183.0 lb

## 2022-04-22 DIAGNOSIS — Z6832 Body mass index (BMI) 32.0-32.9, adult: Secondary | ICD-10-CM

## 2022-04-22 DIAGNOSIS — F3289 Other specified depressive episodes: Secondary | ICD-10-CM

## 2022-04-22 DIAGNOSIS — E669 Obesity, unspecified: Secondary | ICD-10-CM

## 2022-04-22 DIAGNOSIS — E65 Localized adiposity: Secondary | ICD-10-CM | POA: Diagnosis not present

## 2022-04-22 DIAGNOSIS — E559 Vitamin D deficiency, unspecified: Secondary | ICD-10-CM

## 2022-04-22 MED ORDER — BUPROPION HCL ER (SR) 100 MG PO TB12: 100.0000 mg | ORAL_TABLET | Freq: Every day | ORAL | 0 refills | Status: AC

## 2022-04-22 MED ORDER — VITAMIN D (ERGOCALCIFEROL) 1.25 MG (50000 UNIT) PO CAPS: 50000.0000 [IU] | ORAL_CAPSULE | ORAL | 0 refills | Status: AC

## 2022-04-23 NOTE — Progress Notes (Signed)
Chief Complaint:   OBESITY Heather Gibbs is here to discuss her progress with her obesity treatment plan along with follow-up of her obesity related diagnoses. Heather Gibbs is on the [place] and states she is following her eating plan approximately 70-80% of the time. Heather Gibbs states she is walking 45-60 minutes 3-4 times per week.  Today's visit was #: 7 Starting weight: 199 lbs Starting date: 11/12/2021 Today's weight: 183 lbs Today's date: 04/22/2022 Total lbs lost to date: 16 lbs Total lbs lost since last in-office visit: 2  Interim History: Heather Gibbs has done well with weight loss since her last visit. She reports she is tired of eating veggie burgers all the time. Breakfast: [employer] milk, peanut butter sandwich and cheese stick. Lunch: veggie burger and beans. Dinner: Veggie chicken or veggie meat. Lapband 2006-2007. Her weight prior to lapband 245 lbs and nadir weight 150's. She is struggling with heartburn and notes pain at site of lapband. Eventually would like lapband removed.    Subjective:   1. Vitamin D deficiency Heather Gibbs is currently taking prescription Vit D 50,000 IU once a week. Denies any nausea, vomiting or muscle weakness.  2. Abdominal pannus [name] struggles with yeast and is using over the counter medications.  3. Other depression Heather Gibbs is currently taking Wellbutrin SR 100 mg. Denies any side effects.  Assessment/Plan:   1. Vitamin D deficiency We will refill Vit D 50,000 IU once a week for 1 month with 0 refills.  Low Vitamin D level contributes to fatigue and are associated with obesity, breast, and colon cancer. She agrees to continue to take prescription Vitamin D '@50'$ ,000 IU every week and will follow-up for routine testing of Vitamin D, at least 2-3 times per year to avoid over-replacement.   -Refill Vitamin D, Ergocalciferol, (DRISDOL) 1.25 MG (50000 UNIT) CAPS capsule; Take 1 capsule (50,000 Units total) by mouth every 7 (seven) days.  Dispense: 4  capsule; Refill: 0  2. Abdominal pannus Heather Gibbs will continue with over the counter medication.   3. Other depression We will refill Wellbutrin SR 100 mg daily for I month with 0 refills. Side effects discussed.    -Refill buPROPion ER (WELLBUTRIN SR) 100 MG 12 hr tablet; Take 1 tablet (100 mg total) by mouth daily.  Dispense: 90 tablet; Refill: 0  4. Obesity, with current BMI of 32.6 Heather Gibbs is currently in the action stage of change. As such, her goal is to continue with weight loss efforts. She has agreed to the [place].   Exercise goals: As is.  Behavioral modification strategies: increasing lean protein intake, increasing vegetables, and increasing water intake.  Heather Gibbs has agreed to follow-up with our clinic in 4 weeks. She was informed of the importance of frequent follow-up visits to maximize her success with intensive lifestyle modifications for her multiple health conditions.   Objective:   Blood pressure 99/65, pulse 68, temperature 97.8 F (36.6 C), height '5\' 3"'$  (1.6 m), weight 183 lb (83 kg), SpO2 96 %. Body mass index is 32.42 kg/m.  General: Cooperative, alert, well developed, in no acute distress. HEENT: Conjunctivae and lids unremarkable. Cardiovascular: Regular rhythm.  Lungs: Normal work of breathing. Neurologic: No focal deficits.   Lab Results  Component Value Date   CREATININE 0.78 11/12/2021   BUN 10 11/12/2021   NA 140 11/12/2021   K 3.8 11/12/2021   CL 101 11/12/2021   CO2 26 11/12/2021   Lab Results  Component Value Date   ALT 15 11/12/2021  AST 21 11/12/2021   ALKPHOS 75 11/12/2021   BILITOT 0.3 11/12/2021   Lab Results  Component Value Date   HGBA1C 5.5 11/12/2021   Lab Results  Component Value Date   INSULIN 10.1 11/12/2021   Lab Results  Component Value Date   TSH 0.819 11/12/2021   Lab Results  Component Value Date   CHOL 197 11/12/2021   HDL 61 11/12/2021   LDLCALC 121 (H) 11/12/2021   TRIG 82 11/12/2021    Lab Results  Component Value Date   VD25OH 20.5 (L) 11/12/2021   Lab Results  Component Value Date   WBC 6.6 11/12/2021   HGB 13.6 11/12/2021   HCT 40.6 11/12/2021   MCV 95 11/12/2021   PLT 288 11/12/2021   Lab Results  Component Value Date   IRON 83 11/12/2021   TIBC 276 11/12/2021   FERRITIN 73 11/12/2021   Attestation Statements:   Reviewed by clinician on day of visit: allergies, medications, problem list, medical history, surgical history, family history, social history, and previous encounter notes.  I, Brendell Tyus, RMA, am acting as transcriptionist for Everardo Pacific, FNP..  I have reviewed the above documentation for accuracy and completeness, and I agree with the above. Everardo Pacific, FNP

## 2022-05-14 ENCOUNTER — Ambulatory Visit (INDEPENDENT_AMBULATORY_CARE_PROVIDER_SITE_OTHER): Payer: 59 | Admitting: Nurse Practitioner

## 2022-06-18 ENCOUNTER — Encounter (INDEPENDENT_AMBULATORY_CARE_PROVIDER_SITE_OTHER): Payer: Self-pay

## 2022-08-04 ENCOUNTER — Encounter (INDEPENDENT_AMBULATORY_CARE_PROVIDER_SITE_OTHER): Payer: Self-pay | Admitting: *Deleted

## 2022-09-20 ENCOUNTER — Encounter (INDEPENDENT_AMBULATORY_CARE_PROVIDER_SITE_OTHER): Payer: Self-pay | Admitting: Gastroenterology
